# Patient Record
Sex: Female | Born: 1960 | Race: White | Hispanic: No | Marital: Married | Smoking: Former smoker
Health system: Southern US, Community
[De-identification: ages and names within clinical notes are randomized; demographics above are authoritative.]

## PROBLEM LIST (undated history)

## (undated) DIAGNOSIS — K219 Gastro-esophageal reflux disease without esophagitis: Secondary | ICD-10-CM

## (undated) DIAGNOSIS — E042 Nontoxic multinodular goiter: Secondary | ICD-10-CM

## (undated) DIAGNOSIS — R32 Unspecified urinary incontinence: Secondary | ICD-10-CM

## (undated) DIAGNOSIS — I071 Rheumatic tricuspid insufficiency: Principal | ICD-10-CM

## (undated) DIAGNOSIS — G4700 Insomnia, unspecified: Secondary | ICD-10-CM

## (undated) DIAGNOSIS — M5030 Other cervical disc degeneration, unspecified cervical region: Secondary | ICD-10-CM

## (undated) DIAGNOSIS — K625 Hemorrhage of anus and rectum: Secondary | ICD-10-CM

## (undated) DIAGNOSIS — M549 Dorsalgia, unspecified: Secondary | ICD-10-CM

## (undated) DIAGNOSIS — Z87440 Personal history of urinary (tract) infections: Secondary | ICD-10-CM

## (undated) DIAGNOSIS — T7840XA Allergy, unspecified, initial encounter: Secondary | ICD-10-CM

## (undated) DIAGNOSIS — Z0000 Encounter for general adult medical examination without abnormal findings: Secondary | ICD-10-CM

## (undated) DIAGNOSIS — K5900 Constipation, unspecified: Secondary | ICD-10-CM

## (undated) DIAGNOSIS — IMO0001 Reserved for inherently not codable concepts without codable children: Secondary | ICD-10-CM

## (undated) DIAGNOSIS — L309 Dermatitis, unspecified: Secondary | ICD-10-CM

## (undated) DIAGNOSIS — K648 Other hemorrhoids: Secondary | ICD-10-CM

## (undated) DIAGNOSIS — R05 Cough: Secondary | ICD-10-CM

## (undated) HISTORY — DX: Nontoxic multinodular goiter: E04.2

## (undated) HISTORY — DX: Dermatitis, unspecified: L30.9

## (undated) HISTORY — DX: Dorsalgia, unspecified: M54.9

## (undated) HISTORY — DX: Unspecified urinary incontinence: R32

## (undated) HISTORY — PX: TONSILLECTOMY: SUR1361

## (undated) HISTORY — DX: Encounter for general adult medical examination without abnormal findings: Z00.00

## (undated) HISTORY — DX: Cough: R05

## (undated) HISTORY — DX: Gastro-esophageal reflux disease without esophagitis: K21.9

## (undated) HISTORY — DX: Reserved for inherently not codable concepts without codable children: IMO0001

## (undated) HISTORY — DX: Personal history of urinary (tract) infections: Z87.440

## (undated) HISTORY — DX: Other cervical disc degeneration, unspecified cervical region: M50.30

## (undated) HISTORY — DX: Allergy, unspecified, initial encounter: T78.40XA

## (undated) HISTORY — DX: Constipation, unspecified: K59.00

## (undated) HISTORY — DX: Other hemorrhoids: K64.8

## (undated) HISTORY — PX: OTHER SURGICAL HISTORY: SHX169

## (undated) HISTORY — DX: Rheumatic tricuspid insufficiency: I07.1

## (undated) HISTORY — DX: Insomnia, unspecified: G47.00

## (undated) HISTORY — DX: Hemorrhage of anus and rectum: K62.5

---

## 1998-05-05 ENCOUNTER — Encounter: Admission: RE | Admit: 1998-05-05 | Discharge: 1998-08-03 | Payer: Self-pay | Admitting: Gynecology

## 1998-07-21 ENCOUNTER — Inpatient Hospital Stay (HOSPITAL_COMMUNITY): Admission: AD | Admit: 1998-07-21 | Discharge: 1998-07-23 | Payer: Self-pay | Admitting: Obstetrics and Gynecology

## 1998-09-03 ENCOUNTER — Other Ambulatory Visit: Admission: RE | Admit: 1998-09-03 | Discharge: 1998-09-03 | Payer: Self-pay | Admitting: Obstetrics and Gynecology

## 1998-09-03 ENCOUNTER — Encounter (HOSPITAL_COMMUNITY): Admission: RE | Admit: 1998-09-03 | Discharge: 1998-12-02 | Payer: Self-pay | Admitting: Obstetrics and Gynecology

## 1998-12-03 ENCOUNTER — Encounter (HOSPITAL_COMMUNITY): Admission: RE | Admit: 1998-12-03 | Discharge: 1999-03-03 | Payer: Self-pay | Admitting: Obstetrics and Gynecology

## 1999-03-08 ENCOUNTER — Encounter (HOSPITAL_COMMUNITY): Admission: RE | Admit: 1999-03-08 | Discharge: 1999-06-06 | Payer: Self-pay | Admitting: Obstetrics and Gynecology

## 1999-06-08 ENCOUNTER — Encounter: Admission: RE | Admit: 1999-06-08 | Discharge: 1999-08-16 | Payer: Self-pay | Admitting: Obstetrics and Gynecology

## 1999-10-14 ENCOUNTER — Other Ambulatory Visit: Admission: RE | Admit: 1999-10-14 | Discharge: 1999-10-14 | Payer: Self-pay | Admitting: Obstetrics and Gynecology

## 2000-11-02 ENCOUNTER — Other Ambulatory Visit: Admission: RE | Admit: 2000-11-02 | Discharge: 2000-11-02 | Payer: Self-pay | Admitting: Obstetrics and Gynecology

## 2002-05-23 ENCOUNTER — Other Ambulatory Visit: Admission: RE | Admit: 2002-05-23 | Discharge: 2002-05-23 | Payer: Self-pay | Admitting: Gynecology

## 2002-07-10 ENCOUNTER — Other Ambulatory Visit: Admission: RE | Admit: 2002-07-10 | Discharge: 2002-07-10 | Payer: Self-pay | Admitting: Gynecology

## 2003-02-10 ENCOUNTER — Inpatient Hospital Stay (HOSPITAL_COMMUNITY): Admission: AD | Admit: 2003-02-10 | Discharge: 2003-02-10 | Payer: Self-pay | Admitting: Gynecology

## 2003-02-28 ENCOUNTER — Inpatient Hospital Stay (HOSPITAL_COMMUNITY): Admission: AD | Admit: 2003-02-28 | Discharge: 2003-03-02 | Payer: Self-pay | Admitting: Gynecology

## 2003-03-09 ENCOUNTER — Encounter: Admission: RE | Admit: 2003-03-09 | Discharge: 2003-04-08 | Payer: Self-pay | Admitting: Gynecology

## 2003-04-16 ENCOUNTER — Other Ambulatory Visit: Admission: RE | Admit: 2003-04-16 | Discharge: 2003-04-16 | Payer: Self-pay | Admitting: Gynecology

## 2003-05-09 ENCOUNTER — Encounter: Admission: RE | Admit: 2003-05-09 | Discharge: 2003-06-08 | Payer: Self-pay | Admitting: Gynecology

## 2003-06-09 ENCOUNTER — Encounter: Admission: RE | Admit: 2003-06-09 | Discharge: 2003-07-09 | Payer: Self-pay | Admitting: Gynecology

## 2003-08-07 ENCOUNTER — Encounter: Admission: RE | Admit: 2003-08-07 | Discharge: 2003-09-06 | Payer: Self-pay | Admitting: Gynecology

## 2003-10-07 ENCOUNTER — Encounter: Admission: RE | Admit: 2003-10-07 | Discharge: 2003-11-06 | Payer: Self-pay | Admitting: Gynecology

## 2003-12-07 ENCOUNTER — Encounter: Admission: RE | Admit: 2003-12-07 | Discharge: 2004-01-06 | Payer: Self-pay | Admitting: Gynecology

## 2004-01-07 ENCOUNTER — Encounter: Admission: RE | Admit: 2004-01-07 | Discharge: 2004-02-06 | Payer: Self-pay | Admitting: Gynecology

## 2004-02-25 ENCOUNTER — Ambulatory Visit: Payer: Self-pay | Admitting: Internal Medicine

## 2004-04-26 ENCOUNTER — Ambulatory Visit: Payer: Self-pay | Admitting: Internal Medicine

## 2004-05-31 ENCOUNTER — Other Ambulatory Visit: Admission: RE | Admit: 2004-05-31 | Discharge: 2004-05-31 | Payer: Self-pay | Admitting: Gynecology

## 2004-06-02 ENCOUNTER — Ambulatory Visit: Payer: Self-pay | Admitting: Internal Medicine

## 2005-02-21 ENCOUNTER — Ambulatory Visit: Payer: Self-pay | Admitting: Internal Medicine

## 2005-03-06 ENCOUNTER — Ambulatory Visit: Payer: Self-pay | Admitting: Internal Medicine

## 2005-03-20 ENCOUNTER — Ambulatory Visit: Payer: Self-pay | Admitting: Internal Medicine

## 2005-05-01 ENCOUNTER — Ambulatory Visit: Payer: Self-pay | Admitting: Internal Medicine

## 2005-05-29 ENCOUNTER — Ambulatory Visit: Payer: Self-pay | Admitting: Internal Medicine

## 2005-07-17 ENCOUNTER — Ambulatory Visit: Payer: Self-pay | Admitting: Pulmonary Disease

## 2005-10-28 ENCOUNTER — Ambulatory Visit: Payer: Self-pay | Admitting: Family Medicine

## 2005-11-09 ENCOUNTER — Ambulatory Visit: Payer: Self-pay | Admitting: Family Medicine

## 2005-11-17 ENCOUNTER — Ambulatory Visit: Payer: Self-pay | Admitting: Family Medicine

## 2005-11-28 ENCOUNTER — Ambulatory Visit: Payer: Self-pay | Admitting: Family Medicine

## 2006-06-25 ENCOUNTER — Other Ambulatory Visit: Admission: RE | Admit: 2006-06-25 | Discharge: 2006-06-25 | Payer: Self-pay | Admitting: Gynecology

## 2006-07-10 ENCOUNTER — Encounter (INDEPENDENT_AMBULATORY_CARE_PROVIDER_SITE_OTHER): Payer: Self-pay | Admitting: *Deleted

## 2006-07-10 ENCOUNTER — Ambulatory Visit (HOSPITAL_COMMUNITY): Admission: RE | Admit: 2006-07-10 | Discharge: 2006-07-10 | Payer: Self-pay | Admitting: Gastroenterology

## 2006-10-24 ENCOUNTER — Encounter: Admission: RE | Admit: 2006-10-24 | Discharge: 2007-01-22 | Payer: Self-pay | Admitting: Otolaryngology

## 2007-01-07 DIAGNOSIS — J45909 Unspecified asthma, uncomplicated: Secondary | ICD-10-CM | POA: Insufficient documentation

## 2007-01-07 DIAGNOSIS — F329 Major depressive disorder, single episode, unspecified: Secondary | ICD-10-CM | POA: Insufficient documentation

## 2007-07-24 ENCOUNTER — Other Ambulatory Visit: Admission: RE | Admit: 2007-07-24 | Discharge: 2007-07-24 | Payer: Self-pay | Admitting: Gynecology

## 2007-08-09 ENCOUNTER — Ambulatory Visit (HOSPITAL_COMMUNITY): Admission: RE | Admit: 2007-08-09 | Discharge: 2007-08-09 | Payer: Self-pay | Admitting: Otolaryngology

## 2007-09-08 LAB — HM PAP SMEAR

## 2008-07-07 ENCOUNTER — Encounter: Admission: RE | Admit: 2008-07-07 | Discharge: 2008-07-07 | Payer: Self-pay | Admitting: Family Medicine

## 2010-05-08 ENCOUNTER — Encounter: Payer: Self-pay | Admitting: Otolaryngology

## 2010-09-02 NOTE — Op Note (Signed)
Patricia Roy, ROTHMAN                 ACCOUNT NO.:  [id number]   MEDICAL RECORD NO.:  [id number]          PATIENT TYPE:  AMB   LOCATION:  ENDO                         FACILITY:  [facility]   PHYSICIAN:  Shirley Friar, MDDATE OF BIRTH:  [birth year]   DATE OF PROCEDURE:  DATE OF DISCHARGE:  07/10/2006                               OPERATIVE REPORT   INDICATION:  Hoarseness, chronic cough, evaluated for atypical reflux.   FINDINGS:  DeMeester score on day 1 of 20.4 with normal being less than  14.72.  DeMeester score on day 2 was 12.7 with normal being less than  14.72.  Total DeMeester score 20.1 with normal being less than 14.72.   IMPRESSION:  Significant acidic reflux on her 48-hour pH Bravo study.  Will recommend a trial of Zegerid 40 mg p.o. nightly x1 month.  Will  have the patient follow up with Dr. Ewing Schlein in three weeks.      Shirley Friar, MD  Electronically Signed     VCS/MEDQ  D:  07/27/2006  T:  07/28/2006  Job:  [id number]

## 2010-09-02 NOTE — Discharge Summary (Signed)
NAME:  Patricia Roy, Patricia Roy                           ACCOUNT NO.:  [id number]   MEDICAL RECORD NO.:  [id number]                   PATIENT TYPE:  INP   LOCATION:  9107                                 FACILITY:  [facility]   PHYSICIAN:  Ivor Costa. Farrel Gobble, M.D.              DATE OF BIRTH:  01/04/1961   DATE OF ADMISSION:  02/28/2003  DATE OF DISCHARGE:  03/02/2003                                 DISCHARGE SUMMARY   DISCHARGE DIAGNOSES:  Intrauterine pregnancy at term with a spontaneous  vaginal delivery.   PROCEDURE:  Spontaneous vaginal delivery of a viable female with a perineal  first-degree laceration with repair.   HISTORY OF PRESENT ILLNESS:  A 50 year old gravida 3, para 2, with a last  menstrual period of May 30, 2002, with an [facility] of March 07, 2003.  Estimated gestational age of 89 with contractions.  Prenatally, she had no  known complications. She does have history of asthma. She did have a history  of gestational diabetes with the second baby, but not with the current  pregnancy.   LABORATORIES:  Blood type is A positive, antibody, serology nonreactive,  rubella positive, hepatitis nonreactive, HIV nonreactive, and group B strep  negative.   HOSPITAL COURSE AND TREATMENT:  The patient presented on February 28, 2003,  in labor. She was 39 weeks.  She progressed to complete. She delivered a  viable female with a first-degree perineal laceration with repair.  The  baby's Apgar's were 9/9. Birth weight was 6 pounds 13 ounces. Postpartum she  remained afebrile, voiding, and was discharged home on her second postpartum  day. She did have a history of postpartum depression with her second baby,  but declined medication at this time and will call if needed.   DISCHARGE LABORATORY:  White count 13.5, hemoglobin 13.3, hematocrit 37.4,  and platelet count 180,00.   DISPOSITION:  She is discharged home with instructions to follow up in six  weeks or as needed. She is to continue prenatal  vitamins and iron daily,  Motrin as needed for pain.     Davonna Belling. Young, N.P.                      Ivor Costa. Farrel Gobble, M.D.    [place] Lanius  D:  03/25/2003  T:  03/25/2003  Job:  [id number]

## 2010-09-02 NOTE — Op Note (Signed)
Patricia Roy, Patricia Roy                 ACCOUNT NO.:  [id number]   MEDICAL RECORD NO.:  [id number]          PATIENT TYPE:  AMB   LOCATION:  ENDO                         FACILITY:  [facility]   PHYSICIAN:  Shirley Friar, MDDATE OF BIRTH:  21-Jul-1960   DATE OF PROCEDURE:  07/10/2006  DATE OF DISCHARGE:                               OPERATIVE REPORT   PROCEDURE:  Upper endoscopy and Bravo capsule placement.   INDICATION:  Chronic cough, hoarseness, questionable atypical reflux.   MEDICATIONS:  Fentanyl 75 mcg IV, Versed 7 mg IV, Cetacaine spray x2.   FINDINGS:  Endoscope was inserted to the oropharynx and esophagus was  intubated which was normal in its entirety.  The GE junction was noted  to be at approximately 35 cm from the incisors.  Endoscope was passed  down into the stomach which revealed scattered erythematous areas in the  antrum consistent with the erosions.  The rest of the stomach lining was  normal.  Retroflexion revealed normal angularis, cardia and fundus.  Endoscope was straightened and advanced down to the duodenal bulb and  second portion of duodenum which were both normal.  Endoscope withdrawn  back into the stomach and two biopsies of the antrum were taken.  Endoscope was then withdrawn back into the esophagus and GE junction was  again verified at 35 cm from the incisors.  Endoscope was then withdrawn  completely and a Bravo capsule catheter was inserted.  In the usual  fashion, the Bravo capsule was attached at 29 cm which is approximately  6 cm above the GE junction.  Endoscope was reinserted and verified  correct and adequate placement of the Bravo capsule without any  immediate complications.   ASSESSMENT:  1. Antral erosions status post biopsy.  2. Otherwise normal esophagogastroduodenoscopy.  3. Status post Bravo capsule placement in esophagus.   PLAN:  Follow-up on Bravo capsule reading to determine whether her  atypical symptoms are due to acidic  reflux.      Shirley Friar, MD  Electronically Signed     VCS/MEDQ  D:  07/10/2006  T:  07/10/2006  Job:  [id number]   cc:   Petra Kuba, M.D.  Hermelinda Medicus, M.D.  Jeffrey A. Tawanna Cooler, MD

## 2011-05-09 ENCOUNTER — Ambulatory Visit: Payer: Self-pay | Admitting: Cardiology

## 2011-05-17 ENCOUNTER — Ambulatory Visit (INDEPENDENT_AMBULATORY_CARE_PROVIDER_SITE_OTHER): Payer: BC Managed Care – PPO | Admitting: Cardiology

## 2011-05-17 ENCOUNTER — Encounter: Payer: Self-pay | Admitting: Cardiology

## 2011-05-17 VITALS — BP 102/50 | HR 79 | Ht 64.0 in | Wt 148.0 lb

## 2011-05-17 DIAGNOSIS — I071 Rheumatic tricuspid insufficiency: Secondary | ICD-10-CM

## 2011-05-17 DIAGNOSIS — R0609 Other forms of dyspnea: Secondary | ICD-10-CM

## 2011-05-17 DIAGNOSIS — I340 Nonrheumatic mitral (valve) insufficiency: Secondary | ICD-10-CM

## 2011-05-17 DIAGNOSIS — R0600 Dyspnea, unspecified: Secondary | ICD-10-CM

## 2011-05-17 DIAGNOSIS — R0602 Shortness of breath: Secondary | ICD-10-CM

## 2011-05-17 HISTORY — DX: Rheumatic tricuspid insufficiency: I07.1

## 2011-05-17 NOTE — Progress Notes (Signed)
  HPI: 51 year old female for evaluation of dyspnea. Patient states she has had a cough for approximately 12 years. She has had extensive evaluations including pulmonary workup, ENT and GI evaluation. Apparently those have been unrevealing. I do not have records available. She did have previous GI evaluation that suggested reflux but she stated medication did not improve. She has times where she will have coughing spells for 20 minutes. These can occur at any time but are more prominent at night. They are not related to meals. When she has these coughing spells she feels short of breath. She otherwise does not have dyspnea on exertion, orthopnea, PND, pedal edema, syncope or exertional chest pain. Note her cough is nonproductive. Her sister saw a television show where cough was related to mitral regurgitation. She therefore presented for evaluation.  No current outpatient prescriptions on file.    Allergies  Allergen Reactions  . Paroxetine   . Sulfonamide Derivatives     Past Medical History  Diagnosis Date  . Asthma   . Cough   . GERD (gastroesophageal reflux disease)     Past Surgical History  Procedure Date  . Tonsillectomy     History   Social History  . Marital Status: Married    Spouse Name: N/A    Number of Children: 3  . Years of Education: N/A   Occupational History  .     Social History Main Topics  . Smoking status: Current Everyday Smoker -- 0.5 packs/day  . Smokeless tobacco: Not on file  . Alcohol Use: Yes     Occasional  . Drug Use: Not on file  . Sexually Active: Not on file   Other Topics Concern  . Not on file   Social History Narrative  . No narrative on file    Family History  Problem Relation Age of Onset  . Heart disease Mother     H/O valve replacement at 86    ROS: Cough but no fevers or chills, hemoptysis, dysphasia, odynophagia, melena, hematochezia, dysuria, hematuria, rash, seizure activity, orthopnea, PND, pedal edema, claudication.  Remaining systems are negative.  Physical Exam:   Blood pressure 102/50, pulse 79, height 5\' 4"  (1.626 m), weight 148 lb (67.132 kg).  General:  Well developed/well nourished in NAD Skin warm/dry Patient not depressed No peripheral clubbing Back-normal HEENT-normal/normal eyelids Neck supple/normal carotid upstroke bilaterally; no bruits; no JVD; no thyromegaly chest - CTA/ normal expansion CV - RRR/normal S1 and S2; no rubs or gallops;  PMI nondisplaced; 1/6 systolic ejection murmur Abdomen -NT/ND, no HSM, no mass, + bowel sounds, no bruit 2+ femoral pulses, no bruits Ext-no edema, chords, 2+ DP Neuro-grossly nonfocal  ECG sinus rhythm at a rate of 79. RV conduction delay.

## 2011-05-17 NOTE — Assessment & Plan Note (Signed)
Doubt this is causing her cough and dyspnea. Echocardiogram to exclude MR. Not noted on examination.

## 2011-05-17 NOTE — Assessment & Plan Note (Signed)
Patient has a chronic cough for approximately 12 years associated with dyspnea. She only feels shortness of breath with her cough. She otherwise does not have dyspnea on exertion, orthopnea, PND. She is not volume overloaded on examination. Her electrocardiogram is normal. I doubt this represents cardiac etiology. We will arrange an echocardiogram to quantify LV function. If normal I do not think further cardiac evaluation is indicated.

## 2011-05-17 NOTE — Patient Instructions (Signed)
Your physician recommends that you schedule a follow-up appointment in: AS NEEDED PENDING TEST RESULTS  Your physician has requested that you have an echocardiogram. Echocardiography is a painless test that uses sound waves to create images of your heart. It provides your doctor with information about the size and shape of your heart and how well your heart's chambers and valves are working. This procedure takes approximately one hour. There are no restrictions for this procedure.    

## 2011-05-23 ENCOUNTER — Telehealth: Payer: Self-pay | Admitting: Cardiology

## 2011-05-23 ENCOUNTER — Ambulatory Visit (HOSPITAL_COMMUNITY): Payer: BC Managed Care – PPO | Attending: Cardiology | Admitting: Radiology

## 2011-05-23 DIAGNOSIS — R0989 Other specified symptoms and signs involving the circulatory and respiratory systems: Secondary | ICD-10-CM

## 2011-05-23 DIAGNOSIS — R011 Cardiac murmur, unspecified: Secondary | ICD-10-CM | POA: Insufficient documentation

## 2011-05-23 DIAGNOSIS — R0609 Other forms of dyspnea: Secondary | ICD-10-CM

## 2011-05-23 DIAGNOSIS — I059 Rheumatic mitral valve disease, unspecified: Secondary | ICD-10-CM

## 2011-05-23 DIAGNOSIS — R0602 Shortness of breath: Secondary | ICD-10-CM

## 2011-05-23 NOTE — Telephone Encounter (Signed)
Pt rtn call to [place], [phone]

## 2011-05-23 NOTE — Telephone Encounter (Signed)
Spoke with pt, aware of echo results. 

## 2013-07-08 LAB — HM MAMMOGRAPHY

## 2014-07-28 ENCOUNTER — Ambulatory Visit (INDEPENDENT_AMBULATORY_CARE_PROVIDER_SITE_OTHER): Payer: Managed Care, Other (non HMO) | Admitting: Podiatry

## 2014-07-28 ENCOUNTER — Encounter: Payer: Self-pay | Admitting: Podiatry

## 2014-07-28 VITALS — BP 116/79 | HR 92 | Ht 64.0 in | Wt 145.0 lb

## 2014-07-28 DIAGNOSIS — M79673 Pain in unspecified foot: Secondary | ICD-10-CM | POA: Diagnosis not present

## 2014-07-28 DIAGNOSIS — S90922A Unspecified superficial injury of left foot, initial encounter: Secondary | ICD-10-CM | POA: Insufficient documentation

## 2014-07-28 DIAGNOSIS — M79606 Pain in leg, unspecified: Secondary | ICD-10-CM | POA: Insufficient documentation

## 2014-07-28 DIAGNOSIS — M79605 Pain in left leg: Secondary | ICD-10-CM

## 2014-07-28 NOTE — Patient Instructions (Signed)
Seen for pain in left foot. Injury during pedicure is resolving now. Had muscle strain on left with abnormal gait pattern. Need custom orthotics. Will contact insurance co for benefit info.

## 2014-07-28 NOTE — Progress Notes (Signed)
Subjective: 54 year old female presents stating that she has had a pedicure and got a cut at the base of the 5th Metatarsal that caused pain and swelling for a few days. It is resolved but now having pain in left calf. Incident happened about 10 days ago.   Review of Systems - Negative except wearing glasses.  Objective: Dermatologic: Old superficial cut in skin at the base of the 5th metatarsal. No associated erythema or edema noted. Neurovascular status are within normal. Orthopedic: Prominent sharp bony projection at the injury site, which correspond with the styloid process. Enlarged metatarsal head with hypermobility bilateral. Painful left calf with squeeze without edema or erythema.  Assessment: Skin abrasion over prominent Styloid process left. Area has healed.  Left calf pain secondary to abnormal gait pattern. Hypermobile first ray bilateral.  Plan: Reviewed findings. May benefit from custom orthotics.

## 2014-08-21 ENCOUNTER — Ambulatory Visit: Payer: BC Managed Care – PPO | Admitting: Family Medicine

## 2014-09-07 ENCOUNTER — Telehealth: Payer: Self-pay | Admitting: *Deleted

## 2014-09-07 ENCOUNTER — Encounter: Payer: Self-pay | Admitting: *Deleted

## 2014-09-07 NOTE — Telephone Encounter (Signed)
Pre-Visit Call completed with patient and chart updated.   Pre-Visit Info documented in Specialty Comments under [employer].    

## 2014-09-07 NOTE — Addendum Note (Signed)
Addended by: Noreene LarssonLARSON, Ruperto Kiernan A on: 09/07/2014 02:08 PM   Modules accepted: Medications

## 2014-09-07 NOTE — Telephone Encounter (Signed)
Unable to reach patient at time of Pre-Visit Call.  Left message for patient to return call when available.    

## 2014-09-08 ENCOUNTER — Ambulatory Visit (INDEPENDENT_AMBULATORY_CARE_PROVIDER_SITE_OTHER): Payer: Managed Care, Other (non HMO) | Admitting: Family Medicine

## 2014-09-08 ENCOUNTER — Encounter: Payer: Self-pay | Admitting: Family Medicine

## 2014-09-08 ENCOUNTER — Ambulatory Visit (HOSPITAL_BASED_OUTPATIENT_CLINIC_OR_DEPARTMENT_OTHER)
Admission: RE | Admit: 2014-09-08 | Discharge: 2014-09-08 | Disposition: A | Discharge status: Home / Self Care | Payer: Managed Care, Other (non HMO) | Source: Ambulatory Visit | Attending: Family Medicine | Admitting: Family Medicine

## 2014-09-08 VITALS — BP 120/82 | HR 84 | Temp 97.5°F | Ht 64.0 in | Wt 147.1 lb

## 2014-09-08 DIAGNOSIS — R319 Hematuria, unspecified: Secondary | ICD-10-CM

## 2014-09-08 DIAGNOSIS — Z0000 Encounter for general adult medical examination without abnormal findings: Secondary | ICD-10-CM | POA: Diagnosis not present

## 2014-09-08 DIAGNOSIS — K921 Melena: Secondary | ICD-10-CM

## 2014-09-08 DIAGNOSIS — K648 Other hemorrhoids: Secondary | ICD-10-CM | POA: Insufficient documentation

## 2014-09-08 DIAGNOSIS — N390 Urinary tract infection, site not specified: Secondary | ICD-10-CM

## 2014-09-08 DIAGNOSIS — J45909 Unspecified asthma, uncomplicated: Secondary | ICD-10-CM | POA: Diagnosis not present

## 2014-09-08 DIAGNOSIS — E041 Nontoxic single thyroid nodule: Secondary | ICD-10-CM | POA: Diagnosis not present

## 2014-09-08 DIAGNOSIS — E049 Nontoxic goiter, unspecified: Secondary | ICD-10-CM

## 2014-09-08 DIAGNOSIS — R053 Chronic cough: Secondary | ICD-10-CM

## 2014-09-08 DIAGNOSIS — R05 Cough: Secondary | ICD-10-CM | POA: Diagnosis not present

## 2014-09-08 DIAGNOSIS — E010 Iodine-deficiency related diffuse (endemic) goiter: Secondary | ICD-10-CM | POA: Diagnosis not present

## 2014-09-08 DIAGNOSIS — E042 Nontoxic multinodular goiter: Secondary | ICD-10-CM

## 2014-09-08 DIAGNOSIS — G4700 Insomnia, unspecified: Secondary | ICD-10-CM

## 2014-09-08 DIAGNOSIS — Z87440 Personal history of urinary (tract) infections: Secondary | ICD-10-CM

## 2014-09-08 DIAGNOSIS — R1013 Epigastric pain: Secondary | ICD-10-CM

## 2014-09-08 DIAGNOSIS — K219 Gastro-esophageal reflux disease without esophagitis: Secondary | ICD-10-CM

## 2014-09-08 DIAGNOSIS — K625 Hemorrhage of anus and rectum: Secondary | ICD-10-CM

## 2014-09-08 DIAGNOSIS — R0600 Dyspnea, unspecified: Secondary | ICD-10-CM

## 2014-09-08 DIAGNOSIS — I071 Rheumatic tricuspid insufficiency: Secondary | ICD-10-CM | POA: Diagnosis not present

## 2014-09-08 DIAGNOSIS — S90922A Unspecified superficial injury of left foot, initial encounter: Secondary | ICD-10-CM

## 2014-09-08 HISTORY — DX: Other hemorrhoids: K64.8

## 2014-09-08 HISTORY — DX: Hemorrhage of anus and rectum: K62.5

## 2014-09-08 HISTORY — DX: Personal history of urinary (tract) infections: Z87.440

## 2014-09-08 HISTORY — DX: Chronic cough: R05.3

## 2014-09-08 LAB — COMPREHENSIVE METABOLIC PANEL
ALT: 16 U/L (ref 0–35)
AST: 19 U/L (ref 0–37)
Albumin: 4.4 g/dL (ref 3.5–5.2)
Alkaline Phosphatase: 45 U/L (ref 39–117)
BUN: 12 mg/dL (ref 6–23)
CO2: 29 mEq/L (ref 19–32)
Calcium: 9.6 mg/dL (ref 8.4–10.5)
Chloride: 103 mEq/L (ref 96–112)
Creatinine, Ser: 0.85 mg/dL (ref 0.40–1.20)
GFR: 74.53 mL/min (ref >60.00)
Glucose, Bld: 93 mg/dL (ref 70–99)
Potassium: 3.8 mEq/L (ref 3.5–5.1)
Sodium: 137 mEq/L (ref 135–145)
Total Bilirubin: 0.4 mg/dL (ref 0.2–1.2)
Total Protein: 6.9 g/dL (ref 6.0–8.3)

## 2014-09-08 LAB — URINALYSIS
Bilirubin Urine: NEGATIVE
Hgb urine dipstick: NEGATIVE
Ketones, ur: NEGATIVE
Leukocytes, UA: NEGATIVE
Nitrite: NEGATIVE
Specific Gravity, Urine: 1.015 (ref 1.000–1.030)
Total Protein, Urine: NEGATIVE
Urine Glucose: NEGATIVE
Urobilinogen, UA: 0.2 (ref 0.0–1.0)
pH: 7.5 (ref 5.0–8.0)

## 2014-09-08 LAB — TSH: TSH: 1.07 u[IU]/mL (ref 0.35–4.50)

## 2014-09-08 LAB — LIPID PANEL
Cholesterol: 159 mg/dL (ref 0–200)
HDL: 65 mg/dL (ref >39.00)
LDL Cholesterol: 68 mg/dL (ref 0–99)
NonHDL: 94
Total CHOL/HDL Ratio: 2
Triglycerides: 132 mg/dL (ref 0.0–149.0)
VLDL: 26.4 mg/dL (ref 0.0–40.0)

## 2014-09-08 LAB — CBC
HCT: 40.8 % (ref 36.0–46.0)
Hemoglobin: 13.9 g/dL (ref 12.0–15.0)
MCHC: 34.1 g/dL (ref 30.0–36.0)
MCV: 90.4 fl (ref 78.0–100.0)
Platelets: 243 10*3/uL (ref 150.0–400.0)
RBC: 4.51 Mil/uL (ref 3.87–5.11)
RDW: 13.3 % (ref 11.5–15.5)
WBC: 8.8 10*3/uL (ref 4.0–10.5)

## 2014-09-08 NOTE — Progress Notes (Signed)
Pre visit review using our clinic review tool, if applicable. No additional management support is needed unless otherwise documented below in the visit note. 

## 2014-09-08 NOTE — Patient Instructions (Addendum)
Encouraged good sleep hygiene such as dark, quiet room. No blue/green glowing lights such as computer screens in bedroom. No alcohol or stimulants in evening. Cut down on caffeine as able. Regular exercise is helpful but not just prior to bed time. Melatonin 2- 10 mg   Probiotic daily such as [employer]est[employer] Advantage or [employer] for Adults A healthy lifestyle and preventive care can promote health and wellness. Preventive health guidelines for women include the following key practices.  A routine yearly physical is a good way to check with your health care provider about your health and preventive screening. It is a chance to share any concerns and updates on your health and to receive a thorough exam.  Visit your dentist for a routine exam and preventive care every 6 months. Brush your teeth twice a day and floss once a day. Good oral hygiene prevents tooth decay and gum disease.  The frequency of eye exams is based on your age, health, family medical history, use of contact lenses, and other factors. Follow your health care provider's recommendations for frequency of eye exams.  Eat a healthy diet. Foods like vegetables, fruits, whole grains, low-fat dairy products, and lean protein foods contain the nutrients you need without too many calories. Decrease your intake of foods high in solid fats, added sugars, and salt. Eat the right amount of calories for you.Get information about a proper diet from your health care provider, if necessary.  Regular physical exercise is one of the most important things you can do for your health. Most adults should get at least 150 minutes of moderate-intensity exercise (any activity that increases your heart rate and causes you to sweat) each week. In addition, most adults need muscle-strengthening exercises on 2 or more days a week.  Maintain a healthy weight. The body mass index (BMI) is a screening tool to identify possible weight  problems. It provides an estimate of body fat based on height and weight. Your health care provider can find your BMI and can help you achieve or maintain a healthy weight.For adults 20 years and older:  A BMI below 18.5 is considered underweight.  A BMI of 18.5 to 24.9 is normal.  A BMI of 25 to 29.9 is considered overweight.  A BMI of 30 and above is considered obese.  Maintain normal blood lipids and cholesterol levels by exercising and minimizing your intake of saturated fat. Eat a balanced diet with plenty of fruit and vegetables. Blood tests for lipids and cholesterol should begin at age 2 and be repeated every 5 years. If your lipid or cholesterol levels are high, you are over 50, or you are at high risk for heart disease, you may need your cholesterol levels checked more frequently.Ongoing high lipid and cholesterol levels should be treated with medicines if diet and exercise are not working.  If you smoke, find out from your health care provider how to quit. If you do not use tobacco, do not start.  Lung cancer screening is recommended for adults aged 74-80 years who are at high risk for developing lung cancer because of a history of smoking. A yearly low-dose CT scan of the lungs is recommended for people who have at least a 30-pack-year history of smoking and are a current smoker or have quit within the past 15 years. A pack year of smoking is smoking an average of 1 pack of cigarettes a day for 1 year (for example: 1 pack a day  for 30 years or 2 packs a day for 15 years). Yearly screening should continue until the smoker has stopped smoking for at least 15 years. Yearly screening should be stopped for people who develop a health problem that would prevent them from having lung cancer treatment.  If you are pregnant, do not drink alcohol. If you are breastfeeding, be very cautious about drinking alcohol. If you are not pregnant and choose to drink alcohol, do not have more than 1 drink  per day. One drink is considered to be 12 ounces (355 mL) of beer, 5 ounces (148 mL) of wine, or 1.5 ounces (44 mL) of liquor.  Avoid use of street drugs. Do not share needles with anyone. Ask for help if you need support or instructions about stopping the use of drugs.  High blood pressure causes heart disease and increases the risk of stroke. Your blood pressure should be checked at least every 1 to 2 years. Ongoing high blood pressure should be treated with medicines if weight loss and exercise do not work.  If you are 52-29 years old, ask your health care provider if you should take aspirin to prevent strokes.  Diabetes screening involves taking a blood sample to check your fasting blood sugar level. This should be done once every 3 years, after age 31, if you are within normal weight and without risk factors for diabetes. Testing should be considered at a younger age or be carried out more frequently if you are overweight and have at least 1 risk factor for diabetes.  Breast cancer screening is essential preventive care for women. You should practice "breast self-awareness." This means understanding the normal appearance and feel of your breasts and may include breast self-examination. Any changes detected, no matter how small, should be reported to a health care provider. Women in their 92s and 30s should have a clinical breast exam (CBE) by a health care provider as part of a regular health exam every 1 to 3 years. After age 55, women should have a CBE every year. Starting at age 91, women should consider having a mammogram (breast X-ray test) every year. Women who have a family history of breast cancer should talk to their health care provider about genetic screening. Women at a high risk of breast cancer should talk to their health care providers about having an MRI and a mammogram every year.  Breast cancer gene (BRCA)-related cancer risk assessment is recommended for women who have family  members with BRCA-related cancers. BRCA-related cancers include breast, ovarian, tubal, and peritoneal cancers. Having family members with these cancers may be associated with an increased risk for harmful changes (mutations) in the breast cancer genes BRCA1 and BRCA2. Results of the assessment will determine the need for genetic counseling and BRCA1 and BRCA2 testing.  Routine pelvic exams to screen for cancer are no longer recommended for nonpregnant women who are considered low risk for cancer of the pelvic organs (ovaries, uterus, and vagina) and who do not have symptoms. Ask your health care provider if a screening pelvic exam is right for you.  If you have had past treatment for cervical cancer or a condition that could lead to cancer, you need Pap tests and screening for cancer for at least 20 years after your treatment. If Pap tests have been discontinued, your risk factors (such as having a new sexual partner) need to be reassessed to determine if screening should be resumed. Some women have medical problems that increase the chance  of getting cervical cancer. In these cases, your health care provider may recommend more frequent screening and Pap tests.  The HPV test is an additional test that may be used for cervical cancer screening. The HPV test looks for the virus that can cause the cell changes on the cervix. The cells collected during the Pap test can be tested for HPV. The HPV test could be used to screen women aged 58 years and older, and should be used in women of any age who have unclear Pap test results. After the age of 70, women should have HPV testing at the same frequency as a Pap test.  Colorectal cancer can be detected and often prevented. Most routine colorectal cancer screening begins at the age of 72 years and continues through age 37 years. However, your health care provider may recommend screening at an earlier age if you have risk factors for colon cancer. On a yearly basis,  your health care provider may provide home test kits to check for hidden blood in the stool. Use of a small camera at the end of a tube, to directly examine the colon (sigmoidoscopy or colonoscopy), can detect the earliest forms of colorectal cancer. Talk to your health care provider about this at age 14, when routine screening begins. Direct exam of the colon should be repeated every 5-10 years through age 1 years, unless early forms of pre-cancerous polyps or small growths are found.  People who are at an increased risk for hepatitis B should be screened for this virus. You are considered at high risk for hepatitis B if:  You were born in a country where hepatitis B occurs often. Talk with your health care provider about which countries are considered high risk.  Your parents were born in a high-risk country and you have not received a shot to protect against hepatitis B (hepatitis B vaccine).  You have HIV or AIDS.  You use needles to inject street drugs.  You live with, or have sex with, someone who has hepatitis B.  You get hemodialysis treatment.  You take certain medicines for conditions like cancer, organ transplantation, and autoimmune conditions.  Hepatitis C blood testing is recommended for all people born from [birth year] through 1965 and any individual with known risks for hepatitis C.  Practice safe sex. Use condoms and avoid high-risk sexual practices to reduce the spread of sexually transmitted infections (STIs). STIs include gonorrhea, chlamydia, syphilis, trichomonas, herpes, HPV, and human immunodeficiency virus (HIV). Herpes, HIV, and HPV are viral illnesses that have no cure. They can result in disability, cancer, and death.  You should be screened for sexually transmitted illnesses (STIs) including gonorrhea and chlamydia if:  You are sexually active and are younger than 24 years.  You are older than 24 years and your health care provider tells you that you are at risk for  this type of infection.  Your sexual activity has changed since you were last screened and you are at an increased risk for chlamydia or gonorrhea. Ask your health care provider if you are at risk.  If you are at risk of being infected with HIV, it is recommended that you take a prescription medicine daily to prevent HIV infection. This is called preexposure prophylaxis (PrEP). You are considered at risk if:  You are a heterosexual woman, are sexually active, and are at increased risk for HIV infection.  You take drugs by injection.  You are sexually active with a partner who has HIV.  Talk with your health care provider about whether you are at high risk of being infected with HIV. If you choose to begin PrEP, you should first be tested for HIV. You should then be tested every 3 months for as long as you are taking PrEP.  Osteoporosis is a disease in which the bones lose minerals and strength with aging. This can result in serious bone fractures or breaks. The risk of osteoporosis can be identified using a bone density scan. Women ages 63 years and over and women at risk for fractures or osteoporosis should discuss screening with their health care providers. Ask your health care provider whether you should take a calcium supplement or vitamin D to reduce the rate of osteoporosis.  Menopause can be associated with physical symptoms and risks. Hormone replacement therapy is available to decrease symptoms and risks. You should talk to your health care provider about whether hormone replacement therapy is right for you.  Use sunscreen. Apply sunscreen liberally and repeatedly throughout the day. You should seek shade when your shadow is shorter than you. Protect yourself by wearing long sleeves, pants, a wide-brimmed hat, and sunglasses year round, whenever you are outdoors.  Once a month, do a whole body skin exam, using a mirror to look at the skin on your back. Tell your health care provider of  new moles, moles that have irregular borders, moles that are larger than a pencil eraser, or moles that have changed in shape or color.  Stay current with required vaccines (immunizations).  Influenza vaccine. All adults should be immunized every year.  Tetanus, diphtheria, and acellular pertussis (Td, Tdap) vaccine. Pregnant women should receive 1 dose of Tdap vaccine during each pregnancy. The dose should be obtained regardless of the length of time since the last dose. Immunization is preferred during the 27th-36th week of gestation. An adult who has not previously received Tdap or who does not know her vaccine status should receive 1 dose of Tdap. This initial dose should be followed by tetanus and diphtheria toxoids (Td) booster doses every 10 years. Adults with an unknown or incomplete history of completing a 3-dose immunization series with Td-containing vaccines should begin or complete a primary immunization series including a Tdap dose. Adults should receive a Td booster every 10 years.  Varicella vaccine. An adult without evidence of immunity to varicella should receive 2 doses or a second dose if she has previously received 1 dose. Pregnant females who do not have evidence of immunity should receive the first dose after pregnancy. This first dose should be obtained before leaving the health care facility. The second dose should be obtained 4-8 weeks after the first dose.  Human papillomavirus (HPV) vaccine. Females aged 13-26 years who have not received the vaccine previously should obtain the 3-dose series. The vaccine is not recommended for use in pregnant females. However, pregnancy testing is not needed before receiving a dose. If a female is found to be pregnant after receiving a dose, no treatment is needed. In that case, the remaining doses should be delayed until after the pregnancy. Immunization is recommended for any person with an immunocompromised condition through the age of 29  years if she did not get any or all doses earlier. During the 3-dose series, the second dose should be obtained 4-8 weeks after the first dose. The third dose should be obtained 24 weeks after the first dose and 16 weeks after the second dose.  Zoster vaccine. One dose is recommended for adults  aged 27 years or older unless certain conditions are present.  Measles, mumps, and rubella (MMR) vaccine. Adults born before [birth year] generally are considered immune to measles and mumps. Adults born in [birth year] or later should have 1 or more doses of MMR vaccine unless there is a contraindication to the vaccine or there is laboratory evidence of immunity to each of the three diseases. A routine second dose of MMR vaccine should be obtained at least 28 days after the first dose for students attending postsecondary schools, health care workers, or international travelers. People who received inactivated measles vaccine or an unknown type of measles vaccine during 1963-1967 should receive 2 doses of MMR vaccine. People who received inactivated mumps vaccine or an unknown type of mumps vaccine before 1979 and are at high risk for mumps infection should consider immunization with 2 doses of MMR vaccine. For females of childbearing age, rubella immunity should be determined. If there is no evidence of immunity, females who are not pregnant should be vaccinated. If there is no evidence of immunity, females who are pregnant should delay immunization until after pregnancy. Unvaccinated health care workers born before [birth year] who lack laboratory evidence of measles, mumps, or rubella immunity or laboratory confirmation of disease should consider measles and mumps immunization with 2 doses of MMR vaccine or rubella immunization with 1 dose of MMR vaccine.  Pneumococcal 13-valent conjugate (PCV13) vaccine. When indicated, a person who is uncertain of her immunization history and has no record of immunization should receive the PCV13 vaccine.  An adult aged 10 years or older who has certain medical conditions and has not been previously immunized should receive 1 dose of PCV13 vaccine. This PCV13 should be followed with a dose of pneumococcal polysaccharide (PPSV23) vaccine. The PPSV23 vaccine dose should be obtained at least 8 weeks after the dose of PCV13 vaccine. An adult aged 51 years or older who has certain medical conditions and previously received 1 or more doses of PPSV23 vaccine should receive 1 dose of PCV13. The PCV13 vaccine dose should be obtained 1 or more years after the last PPSV23 vaccine dose.  Pneumococcal polysaccharide (PPSV23) vaccine. When PCV13 is also indicated, PCV13 should be obtained first. All adults aged 19 years and older should be immunized. An adult younger than age 75 years who has certain medical conditions should be immunized. Any person who resides in a nursing home or long-term care facility should be immunized. An adult smoker should be immunized. People with an immunocompromised condition and certain other conditions should receive both PCV13 and PPSV23 vaccines. People with human immunodeficiency virus (HIV) infection should be immunized as soon as possible after diagnosis. Immunization during chemotherapy or radiation therapy should be avoided. Routine use of PPSV23 vaccine is not recommended for American Indians, [place] Natives, or people younger than 65 years unless there are medical conditions that require PPSV23 vaccine. When indicated, people who have unknown immunization and have no record of immunization should receive PPSV23 vaccine. One-time revaccination 5 years after the first dose of PPSV23 is recommended for people aged 19-64 years who have chronic kidney failure, nephrotic syndrome, asplenia, or immunocompromised conditions. People who received 1-2 doses of PPSV23 before age 12 years should receive another dose of PPSV23 vaccine at age 25 years or later if at least 5 years have passed since the  previous dose. Doses of PPSV23 are not needed for people immunized with PPSV23 at or after age 29 years.  Meningococcal vaccine. Adults with asplenia or persistent complement component  deficiencies should receive 2 doses of quadrivalent meningococcal conjugate (MenACWY-D) vaccine. The doses should be obtained at least 2 months apart. Microbiologists working with certain meningococcal bacteria, [employer] recruits, people at risk during an outbreak, and people who travel to or live in countries with a high rate of meningitis should be immunized. A first-year college student up through age 28 years who is living in a residence hall should receive a dose if she did not receive a dose on or after her 16th birthday. Adults who have certain high-risk conditions should receive one or more doses of vaccine.  Hepatitis A vaccine. Adults who wish to be protected from this disease, have certain high-risk conditions, work with hepatitis A-infected animals, work in hepatitis A research labs, or travel to or work in countries with a high rate of hepatitis A should be immunized. Adults who were previously unvaccinated and who anticipate close contact with an international adoptee during the first 60 days after arrival in the [place] from a country with a high rate of hepatitis A should be immunized.  Hepatitis B vaccine. Adults who wish to be protected from this disease, have certain high-risk conditions, may be exposed to blood or other infectious body fluids, are household contacts or sex partners of hepatitis B positive people, are clients or workers in certain care facilities, or travel to or work in countries with a high rate of hepatitis B should be immunized.  Haemophilus influenzae type b (Hib) vaccine. A previously unvaccinated person with asplenia or sickle cell disease or having a scheduled splenectomy should receive 1 dose of Hib vaccine. Regardless of previous immunization, a recipient of a hematopoietic  stem cell transplant should receive a 3-dose series 6-12 months after her successful transplant. Hib vaccine is not recommended for adults with HIV infection. Preventive Services / Frequency Ages 61 to 67 years  Blood pressure check.** / Every 1 to 2 years.  Lipid and cholesterol check.** / Every 5 years beginning at age 74.  Clinical breast exam.** / Every 3 years for women in their 19s and 34s.  BRCA-related cancer risk assessment.** / For women who have family members with a BRCA-related cancer (breast, ovarian, tubal, or peritoneal cancers).  Pap test.** / Every 2 years from ages 53 through 39. Every 3 years starting at age 53 through age 35 or 65 with a history of 3 consecutive normal Pap tests.  HPV screening.** / Every 3 years from ages 70 through ages 86 to 22 with a history of 3 consecutive normal Pap tests.  Hepatitis C blood test.** / For any individual with known risks for hepatitis C.  Skin self-exam. / Monthly.  Influenza vaccine. / Every year.  Tetanus, diphtheria, and acellular pertussis (Tdap, Td) vaccine.** / Consult your health care provider. Pregnant women should receive 1 dose of Tdap vaccine during each pregnancy. 1 dose of Td every 10 years.  Varicella vaccine.** / Consult your health care provider. Pregnant females who do not have evidence of immunity should receive the first dose after pregnancy.  HPV vaccine. / 3 doses over 6 months, if 45 and younger. The vaccine is not recommended for use in pregnant females. However, pregnancy testing is not needed before receiving a dose.  Measles, mumps, rubella (MMR) vaccine.** / You need at least 1 dose of MMR if you were born in 1957 or later. You may also need a 2nd dose. For females of childbearing age, rubella immunity should be determined. If there is no evidence of immunity,  females who are not pregnant should be vaccinated. If there is no evidence of immunity, females who are pregnant should delay immunization until  after pregnancy.  Pneumococcal 13-valent conjugate (PCV13) vaccine.** / Consult your health care provider.  Pneumococcal polysaccharide (PPSV23) vaccine.** / 1 to 2 doses if you smoke cigarettes or if you have certain conditions.  Meningococcal vaccine.** / 1 dose if you are age 40 to 66 years and a Market researcher living in a residence hall, or have one of several medical conditions, you need to get vaccinated against meningococcal disease. You may also need additional booster doses.  Hepatitis A vaccine.** / Consult your health care provider.  Hepatitis B vaccine.** / Consult your health care provider.  Haemophilus influenzae type b (Hib) vaccine.** / Consult your health care provider. Ages 42 to 32 years  Blood pressure check.** / Every 1 to 2 years.  Lipid and cholesterol check.** / Every 5 years beginning at age 58 years.  Lung cancer screening. / Every year if you are aged 55-80 years and have a 30-pack-year history of smoking and currently smoke or have quit within the past 15 years. Yearly screening is stopped once you have quit smoking for at least 15 years or develop a health problem that would prevent you from having lung cancer treatment.  Clinical breast exam.** / Every year after age 25 years.  BRCA-related cancer risk assessment.** / For women who have family members with a BRCA-related cancer (breast, ovarian, tubal, or peritoneal cancers).  Mammogram.** / Every year beginning at age 62 years and continuing for as long as you are in good health. Consult with your health care provider.  Pap test.** / Every 3 years starting at age 77 years through age 37 or 31 years with a history of 3 consecutive normal Pap tests.  HPV screening.** / Every 3 years from ages 69 years through ages 60 to 64 years with a history of 3 consecutive normal Pap tests.  Fecal occult blood test (FOBT) of stool. / Every year beginning at age 6 years and continuing until age 55 years.  You may not need to do this test if you get a colonoscopy every 10 years.  Flexible sigmoidoscopy or colonoscopy.** / Every 5 years for a flexible sigmoidoscopy or every 10 years for a colonoscopy beginning at age 81 years and continuing until age 31 years.  Hepatitis C blood test.** / For all people born from [birth year] through 1964 and any individual with known risks for hepatitis C.  Skin self-exam. / Monthly.  Influenza vaccine. / Every year.  Tetanus, diphtheria, and acellular pertussis (Tdap/Td) vaccine.** / Consult your health care provider. Pregnant women should receive 1 dose of Tdap vaccine during each pregnancy. 1 dose of Td every 10 years.  Varicella vaccine.** / Consult your health care provider. Pregnant females who do not have evidence of immunity should receive the first dose after pregnancy.  Zoster vaccine.** / 1 dose for adults aged 97 years or older.  Measles, mumps, rubella (MMR) vaccine.** / You need at least 1 dose of MMR if you were born in 1957 or later. You may also need a 2nd dose. For females of childbearing age, rubella immunity should be determined. If there is no evidence of immunity, females who are not pregnant should be vaccinated. If there is no evidence of immunity, females who are pregnant should delay immunization until after pregnancy.  Pneumococcal 13-valent conjugate (PCV13) vaccine.** / Consult your health care provider.  Pneumococcal polysaccharide (  PPSV23) vaccine.** / 1 to 2 doses if you smoke cigarettes or if you have certain conditions.  Meningococcal vaccine.** / Consult your health care provider.  Hepatitis A vaccine.** / Consult your health care provider.  Hepatitis B vaccine.** / Consult your health care provider.  Haemophilus influenzae type b (Hib) vaccine.** / Consult your health care provider. Ages 53 years and over  Blood pressure check.** / Every 1 to 2 years.  Lipid and cholesterol check.** / Every 5 years beginning at age 56  years.  Lung cancer screening. / Every year if you are aged 61-80 years and have a 30-pack-year history of smoking and currently smoke or have quit within the past 15 years. Yearly screening is stopped once you have quit smoking for at least 15 years or develop a health problem that would prevent you from having lung cancer treatment.  Clinical breast exam.** / Every year after age 75 years.  BRCA-related cancer risk assessment.** / For women who have family members with a BRCA-related cancer (breast, ovarian, tubal, or peritoneal cancers).  Mammogram.** / Every year beginning at age 27 years and continuing for as long as you are in good health. Consult with your health care provider.  Pap test.** / Every 3 years starting at age 59 years through age 89 or 7 years with 3 consecutive normal Pap tests. Testing can be stopped between 65 and 70 years with 3 consecutive normal Pap tests and no abnormal Pap or HPV tests in the past 10 years.  HPV screening.** / Every 3 years from ages 51 years through ages 71 or 16 years with a history of 3 consecutive normal Pap tests. Testing can be stopped between 65 and 70 years with 3 consecutive normal Pap tests and no abnormal Pap or HPV tests in the past 10 years.  Fecal occult blood test (FOBT) of stool. / Every year beginning at age 60 years and continuing until age 52 years. You may not need to do this test if you get a colonoscopy every 10 years.  Flexible sigmoidoscopy or colonoscopy.** / Every 5 years for a flexible sigmoidoscopy or every 10 years for a colonoscopy beginning at age 5 years and continuing until age 61 years.  Hepatitis C blood test.** / For all people born from [birth year] through 1965 and any individual with known risks for hepatitis C.  Osteoporosis screening.** / A one-time screening for women ages 67 years and over and women at risk for fractures or osteoporosis.  Skin self-exam. / Monthly.  Influenza vaccine. / Every year.  Tetanus,  diphtheria, and acellular pertussis (Tdap/Td) vaccine.** / 1 dose of Td every 10 years.  Varicella vaccine.** / Consult your health care provider.  Zoster vaccine.** / 1 dose for adults aged 42 years or older.  Pneumococcal 13-valent conjugate (PCV13) vaccine.** / Consult your health care provider.  Pneumococcal polysaccharide (PPSV23) vaccine.** / 1 dose for all adults aged 78 years and older.  Meningococcal vaccine.** / Consult your health care provider.  Hepatitis A vaccine.** / Consult your health care provider.  Hepatitis B vaccine.** / Consult your health care provider.  Haemophilus influenzae type b (Hib) vaccine.** / Consult your health care provider. ** Family history and personal history of risk and conditions may change your health care provider's recommendations. Document Released: 05/30/2001 Document Revised: 08/18/2013 Document Reviewed: 08/29/2010 [facility] Patient Information 2015 [place], [place]. This information is not intended to replace advice given to you by your health care provider. Make sure you discuss any questions  you have with your health care provider.

## 2014-09-08 NOTE — Assessment & Plan Note (Signed)
asymptomatic

## 2014-09-09 ENCOUNTER — Telehealth: Payer: Self-pay

## 2014-09-09 ENCOUNTER — Encounter: Payer: Self-pay | Admitting: Physician Assistant

## 2014-09-09 NOTE — Telephone Encounter (Signed)
Pt notified verbalized understanding. No questions or concerns at this time 

## 2014-09-09 NOTE — Telephone Encounter (Signed)
-----   Message from Stacey A Blyth, MD sent at 09/08/2014 11:27 PM EDT ----- Notify labs normal 

## 2014-09-09 NOTE — Telephone Encounter (Signed)
-----   Message from Bradd CanaryStacey A Blyth, MD sent at 09/08/2014 11:27 PM EDT ----- Notify labs normal

## 2014-09-10 LAB — URINE CULTURE: Colony Count: 60000

## 2014-09-11 ENCOUNTER — Telehealth: Payer: Self-pay | Admitting: Family Medicine

## 2014-09-11 ENCOUNTER — Other Ambulatory Visit: Payer: Self-pay | Admitting: Family Medicine

## 2014-09-11 MED ORDER — NITROFURANTOIN MONOHYD MACRO 100 MG PO CAPS: 100.0000 mg | ORAL_CAPSULE | Freq: Two times a day (BID) | ORAL | Status: DC

## 2014-09-11 NOTE — Progress Notes (Signed)
Quick Note:  Notify UTI Ecoli she needs Macrobid 100 mg po bid x 7 days ______

## 2014-09-11 NOTE — Telephone Encounter (Signed)
Based on results of U/S the patient would like to know if they nodules could be the cause of the cough she has had for a long time?

## 2014-09-11 NOTE — Telephone Encounter (Signed)
Patient informed of PCP response to question.

## 2014-09-11 NOTE — Telephone Encounter (Signed)
Unlikely the cause of the cough given how small they are. Usually bigger before they cause symptoms

## 2014-09-18 ENCOUNTER — Telehealth: Payer: Self-pay | Admitting: Family Medicine

## 2014-09-18 NOTE — Telephone Encounter (Signed)
Caller name: Julian ReilCarol Chiquito Relationship to patient: self Can be reached: [phone], cell Pharmacy: [facility] on [address]  Reason for call:  1. Referral for WF for chronic cough and vocal cord nodules, Ph# [phone] 2. Pt still having trouble sleeping after following your advice, can something be called in for her? 3. Pt states she needs referral for OBGYN 4. Pt states that she needs albuterol rescue inhaler for chronic cough

## 2014-09-19 ENCOUNTER — Encounter: Payer: Self-pay | Admitting: Family Medicine

## 2014-09-19 ENCOUNTER — Other Ambulatory Visit: Payer: Self-pay | Admitting: Family Medicine

## 2014-09-19 DIAGNOSIS — Z124 Encounter for screening for malignant neoplasm of cervix: Secondary | ICD-10-CM

## 2014-09-19 DIAGNOSIS — G4700 Insomnia, unspecified: Secondary | ICD-10-CM

## 2014-09-19 DIAGNOSIS — E042 Nontoxic multinodular goiter: Secondary | ICD-10-CM

## 2014-09-19 DIAGNOSIS — R059 Cough, unspecified: Secondary | ICD-10-CM

## 2014-09-19 DIAGNOSIS — R05 Cough: Secondary | ICD-10-CM

## 2014-09-19 DIAGNOSIS — K219 Gastro-esophageal reflux disease without esophagitis: Secondary | ICD-10-CM

## 2014-09-19 HISTORY — DX: Insomnia, unspecified: G47.00

## 2014-09-19 HISTORY — DX: Gastro-esophageal reflux disease without esophagitis: K21.9

## 2014-09-19 HISTORY — DX: Nontoxic multinodular goiter: E04.2

## 2014-09-19 NOTE — Assessment & Plan Note (Signed)
Proceed with thyroid ultrasound

## 2014-09-19 NOTE — Assessment & Plan Note (Signed)
Likely multifactorial, continue acid suppression and start Zyrtec report persistent symptoms

## 2014-09-19 NOTE — Assessment & Plan Note (Signed)
Proceed with CXR

## 2014-09-19 NOTE — Assessment & Plan Note (Signed)
Avoid offending foods, start probiotics. Do not eat large meals in late evening and consider raising head of bed.  

## 2014-09-19 NOTE — Assessment & Plan Note (Signed)
Urine culture reveals EColi UTI, started on Macrobid and probiotics

## 2014-09-19 NOTE — Telephone Encounter (Signed)
I placed referral to ENT and OB/GYN (I assume for cervical cancer screen). Also OK to send in Albuterol  Inhaler 2 puffs po q 6 hours prn sob, wheeze, cough. Disp #1 with 2 rf. Cannot call in a sleep medicine without seeing patient, as these are controlled. Can try Melatonin 2-10 mg qhs

## 2014-09-19 NOTE — Progress Notes (Signed)
Patricia Roy  [id number] November 30, 1960 09/19/2014      Progress Note-Follow Up  Subjective  Chief Complaint  Chief Complaint  Patient presents with  . Establish Care    HPI  Patient is a 54 y.o. female in today for routine medical care. Patient is in today to establish care. She has a long history of chronic cough. She has had this worked up over many years. She has seen an allergist and gets routine allergy shots. Dr. Arlyss Gandy. She also sees Dr. Sharee Pimple of pulmonology. She has seen 2 different 81 in T doctors including one at [facility] with diagnosed her with nodules. She also notes intermittent trouble with postnasal drip and heartburn. Denies dysphasia. Does note some mild abdominal discomfort and urinary frequency. Denies CP/palp/SOB/HA/congestion/fevers/GI c/o. Taking meds as prescribed  Past Medical History  Diagnosis Date  . Asthma   . Cough   . GERD (gastroesophageal reflux disease)   . Depression   . Blood stool     Hemorrhoids  . Urine incontinence   . UTI (urinary tract infection)   . Chronic cough 09/08/2014  . Rectal bleeding 09/08/2014  . 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 Hx: UTI (urinary tract infection) 09/08/2014  . Insomnia 09/19/2014  . Esophageal reflux 09/19/2014    Past Surgical History  Procedure Laterality Date  . Tonsillectomy    . Child birth  20, 2000 and 2004    stitches each time    Family History  Problem Relation Age of Onset  . Heart disease Mother     H/O valve replacement at 56  . Stroke Mother   . Pulmonary embolism Mother   . Cancer Father     Pancreatic cancer  . Alcohol abuse Father   . Alzheimer's disease Maternal Aunt   . Other Sister     chronic lyme  . Pulmonary embolism Brother     DVTs  . Alcohol abuse Brother     recovering  . Migraines Daughter   . Alcohol abuse Brother     recovering  . Alcohol abuse Sister     demenita    History   Social History  . Marital Status: Married    Spouse Name:  N/A  . Number of Children: 3  . Years of Education: 16   Occupational History  . Utilization Review for [employer]    Social History Main Topics  . Smoking status: Current Some Day Smoker -- 0.50 packs/day  . Smokeless tobacco: Never Used  . Alcohol Use: 0.0 oz/week    0 Standard drinks or equivalent per week     Comment: Occasional  . Drug Use: No  . 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 Not on file   Social History Narrative    Current Outpatient Prescriptions on File Prior to Visit  Medication Sig Dispense Refill  . esomeprazole (NEXIUM) 20 MG capsule Take 20 mg by mouth daily at 12 noon.    . fexofenadine (ALLEGRA) 30 MG tablet Take 30 mg by mouth as needed.    Marland Kitchen ketoconazole (NIZORAL) 2 % cream   3  . triamcinolone cream (KENALOG) 0.1 %   3   No current facility-administered medications on file prior to visit.    Allergies  Allergen Reactions  . Paroxetine   . Sulfonamide Derivatives     Review of Systems  Review of Systems  Constitutional: Negative for fever, chills and malaise/fatigue.  HENT: Positive for congestion. Negative for hearing loss and nosebleeds.   Eyes: Negative for discharge.  Respiratory: Positive for cough. Negative for sputum production, shortness of breath and wheezing.   Cardiovascular: Negative for chest pain, palpitations and leg swelling.  Gastrointestinal: Negative for heartburn, nausea, vomiting, abdominal pain, diarrhea and blood in stool.  Genitourinary: Negative for dysuria, urgency, frequency and hematuria.  Musculoskeletal: Negative for myalgias, back pain and falls.  Skin: Negative for rash.  Neurological: Negative for dizziness, tremors, sensory change, focal weakness, loss of consciousness, weakness and headaches.  Endo/Heme/Allergies: Negative for polydipsia. Does not bruise/bleed easily.    Psychiatric/Behavioral: Negative for depression and suicidal ideas. The patient is not nervous/anxious and does not have insomnia.     Objective  BP 120/82 mmHg  Pulse 84  Temp(Src) 97.5 F (36.4 C) (Oral)  Ht  (1.626 m)  Wt 147 lb 2 oz (66.735 kg)  BMI 25.24 kg/m2  SpO2 96%  LMP 09/08/2011  Physical Exam  Physical Exam  Constitutional: She is oriented to person, place, and time and well-developed, well-nourished, and in no distress. No distress.  HENT:  Head: Normocephalic and atraumatic.  Eyes: Conjunctivae are normal.  Neck: Neck supple. No thyromegaly present.  Cardiovascular: Normal rate, regular rhythm and normal heart sounds.   No murmur heard. Pulmonary/Chest: Effort normal and breath sounds normal. She has no wheezes.  Abdominal: She exhibits no distension and no mass.  Musculoskeletal: She exhibits no edema.  Lymphadenopathy:    She has no cervical adenopathy.  Neurological: She is alert and oriented to person, place, and time.  Skin: Skin is warm and dry. No rash noted. She is not diaphoretic.  Psychiatric: Memory, affect and judgment normal.    Lab Results  Component Value Date   TSH 1.07 09/08/2014   Lab Results  Component Value Date   WBC 8.8 09/08/2014   HGB 13.9 09/08/2014   HCT 40.8 09/08/2014   MCV 90.4 09/08/2014   PLT 243.0 09/08/2014   Lab Results  Component Value Date   CREATININE 0.85 09/08/2014   BUN 12 09/08/2014   NA 137 09/08/2014   K 3.8 09/08/2014   CL 103 09/08/2014   CO2 29 09/08/2014   Lab Results  Component Value Date   ALT 16 09/08/2014   AST 19 09/08/2014   ALKPHOS 45 09/08/2014   BILITOT 0.4 09/08/2014   Lab Results  Component Value Date   CHOL 159 09/08/2014   Lab Results  Component Value Date   HDL 65.00 09/08/2014   Lab Results  Component Value Date   LDLCALC 68 09/08/2014   Lab Results  Component Value Date   TRIG 132.0 09/08/2014   Lab Results  Component Value Date   CHOLHDL 2 09/08/2014      Assessment & Plan  Tricuspid regurgitation asymptomatic   Insomnia Encouraged good sleep hygiene such as dark, quiet room. No blue/green glowing lights such as computer screens in bedroom. No alcohol or stimulants in evening. Cut down on caffeine as able. Regular exercise is helpful but not just prior to bed time.    Hx: UTI (urinary tract infection) Urine culture reveals EColi UTI, started on Macrobid and probiotics   Esophageal reflux Avoid offending foods, start probiotics. Do not eat large meals in late evening and consider raising head of bed.    Dyspnea Likely multifactorial, continue acid suppression and start Zyrtec report persistent symptoms   Multiple  thyroid nodules Proceed with thyroid ultrasound   Chronic cough Proceed with CXR

## 2014-09-19 NOTE — Assessment & Plan Note (Signed)
Encouraged good sleep hygiene such as dark, quiet room. No blue/green glowing lights such as computer screens in bedroom. No alcohol or stimulants in evening. Cut down on caffeine as able. Regular exercise is helpful but not just prior to bed time.  

## 2014-09-21 MED ORDER — ALBUTEROL SULFATE HFA 108 (90 BASE) MCG/ACT IN AERS: 2.0000 | INHALATION_SPRAY | Freq: Four times a day (QID) | RESPIRATORY_TRACT | Status: DC | PRN

## 2014-09-21 NOTE — Telephone Encounter (Signed)
Sent in albuterol as instructed and informed the patient of referrals.  The patient stated her sleep problem was discussed at her recent OV.  Melatonin does not work for her.

## 2014-09-21 NOTE — Telephone Encounter (Signed)
So the issue is the sleep meds are not meant to be used routinely and have some habituating potential and some association with dementia in the future. I am willing to let her have Zolpidem 10 mg qhs prn, disp #15 but we have to document her response and have her sign a drug contract for the record to receive this intermittently. This med also has SE of excessive sedation, sleep walking and confusion. These do not happen often but do happen. No driving when sedated

## 2014-09-22 ENCOUNTER — Encounter: Payer: Self-pay | Admitting: Family Medicine

## 2014-09-22 MED ORDER — ZOLPIDEM TARTRATE 10 MG PO TABS: 10.0000 mg | ORAL_TABLET | Freq: Every evening | ORAL | Status: DC | PRN

## 2014-09-22 NOTE — Telephone Encounter (Signed)
Called the patient informed of ALL PCP information/instructions regarding zolpidem prescription.  The patient was very cooperative with instructions and stated she was ok and understood instructions.  Printed contact and attached to hardcopy of zolpidem and put at the front desk.  Did instruct the patient to complete contact and then she would received her prescription to take to her pharmacy.  The patient did verbalize an understanding and agreement of all instructions.

## 2014-09-22 NOTE — Telephone Encounter (Signed)
Printed prescription and called the patient left msg. To call back

## 2014-09-23 ENCOUNTER — Encounter: Payer: Self-pay | Admitting: Family Medicine

## 2014-09-28 ENCOUNTER — Ambulatory Visit (INDEPENDENT_AMBULATORY_CARE_PROVIDER_SITE_OTHER): Payer: Managed Care, Other (non HMO) | Admitting: Physician Assistant

## 2014-09-28 ENCOUNTER — Encounter: Payer: Self-pay | Admitting: Physician Assistant

## 2014-09-28 ENCOUNTER — Encounter (INDEPENDENT_AMBULATORY_CARE_PROVIDER_SITE_OTHER): Payer: Self-pay

## 2014-09-28 VITALS — BP 100/72 | HR 84 | Ht 63.5 in | Wt 145.2 lb

## 2014-09-28 DIAGNOSIS — K629 Disease of anus and rectum, unspecified: Secondary | ICD-10-CM | POA: Diagnosis not present

## 2014-09-28 DIAGNOSIS — K625 Hemorrhage of anus and rectum: Secondary | ICD-10-CM | POA: Diagnosis not present

## 2014-09-28 DIAGNOSIS — Z1211 Encounter for screening for malignant neoplasm of colon: Secondary | ICD-10-CM | POA: Diagnosis not present

## 2014-09-28 MED ORDER — POLYETHYLENE GLYCOL 3350 17 GM/SCOOP PO POWD: ORAL | Status: DC

## 2014-09-28 MED ORDER — HYDROCORTISONE ACE-PRAMOXINE 2.5-1 % EX CREA: 1.0000 "application " | TOPICAL_CREAM | Freq: Three times a day (TID) | CUTANEOUS | Status: DC

## 2014-09-28 NOTE — Patient Instructions (Addendum)
You have been given a separate informational sheet regarding your tobacco use, the importance of quitting and local resources to help you quit. We sent a prescription to [facility] [address] for the generic Miralax for the colonoscopy prep.  You have been scheduled for a colonoscopy. Please follow written instructions given to you at your visit today.  Please pick up your prep supplies at the pharmacy within the next 1-3 days. If you use inhalers (even only as needed), please bring them with you on the day of your procedure. Your physician has requested that you go to www.startemmi.com and enter the access code given to you at your visit today. This web site gives a general overview about your procedure. However, you should still follow specific instructions given to you by our office regarding your preparation for the procedure.

## 2014-09-28 NOTE — Progress Notes (Signed)
Patient ID: Patricia Roy, female   DOB: 02/14/1961, 54 y.o.   MRN: [id number]   Subjective:    Patient ID: Patricia Roy, female    DOB: 1960/08/28, 54 y.o.   MRN: [id number]  HPI Patricia Roy is a pleasant 54 year old white female referred by Dr. Rogelia Rohrer  for evaluation of rectal bleeding. She has not had prior colonoscopy. She is generally in good health has history of a chronic cough which may be reflux related and tricuspid regurgitation. Patient had recent labs done showing hemoglobin of 13.9 hematocrit of 40.8 MCV of 93 and platelets of 243. She says she's had her current symptoms over the past 6-7 months but has had long-term problems with hemorrhoids off and on ever since the birth of her children. She has had a  thrombosed hemorrhoid in the past, and says that her symptoms have "acted up" intermittently. She has felt that her hemorrhoids have been an external. She now notes intermittent bleeding with bowel movements sometimes blood dripping into the commode with bowel movements. She's had a lot of problems with itching and irritation discomfort. No abdominal pain changes in her bowel habits melena or hematochezia. Not having any chronic problems with constipation. Family history is negative for GI diseases far she is aware.  Review of Systems Pertinent positive and negative review of systems were noted in the above HPI section.  All other review of systems was otherwise negative.  Outpatient Encounter Prescriptions as of 09/28/2014  Medication Sig  . albuterol (PROVENTIL HFA;VENTOLIN HFA) 108 (90 BASE) MCG/ACT inhaler Inhale 2 puffs into the lungs every 6 (six) hours as needed for wheezing or shortness of breath.  . esomeprazole (NEXIUM) 20 MG capsule Take 20 mg by mouth daily at 12 noon.  . fexofenadine (ALLEGRA) 30 MG tablet Take 30 mg by mouth as needed.  Marland Kitchen ketoconazole (NIZORAL) 2 % cream   . triamcinolone cream (KENALOG) 0.1 %   . zolpidem (AMBIEN) 10 MG tablet Take 1 tablet (10 mg  total) by mouth at bedtime as needed for sleep.  . polyethylene glycol powder (GLYCOLAX/MIRALAX) powder Take as directed for colonoscopy prep.  . Pramoxine-HC (HYDROCORTISONE ACE-PRAMOXINE) 2.5-1 % CREA Apply 1 application topically 3 (three) times daily.  . [DISCONTINUED] nitrofurantoin, macrocrystal-monohydrate, (MACROBID) 100 MG capsule Take 1 capsule (100 mg total) by mouth 2 (two) times daily. Take for 7 days   No facility-administered encounter medications on file as of 09/28/2014.   Allergies  Allergen Reactions  . Paxil [Paroxetine]   . Sulfonamide Derivatives    Patient Active Problem List   Diagnosis Date Noted  . Insomnia 09/19/2014  . Esophageal reflux 09/19/2014  . Multiple thyroid nodules 09/19/2014  . Chronic cough 09/08/2014  . Rectal bleeding 09/08/2014  . Hx: UTI (urinary tract infection) 09/08/2014  . Pain in lower limb 07/28/2014  . Injury of foot, left, superficial 07/28/2014  . 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 Dyspnea 05/17/2011  . DEPRESSION 01/07/2007  . ASTHMA 01/07/2007   History   Social History  . Marital Status: Married    Spouse Name: N/A  . Number of Children: 3  . Years of Education: 16   Occupational History  . Utilization Review for [employer]    Social History Main Topics  . Smoking status: Current Some Day Smoker -- 0.50 packs/day    Types: Cigarettes  . Smokeless tobacco: Never Used  . Alcohol Use: 0.0 oz/week    0 Standard drinks or equivalent per week     Comment: Occasional  .  Drug Use: No  . 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 Not on file   Social History Narrative    Patricia Roy family history includes Alcohol abuse in her brother, brother, father, and sister; Alzheimer's disease in her maternal aunt; Dementia in her sister; Heart disease in her mother; Migraines in her daughter; Other in her  sister; Pancreatic cancer in her father; Pulmonary embolism in her brother and mother; Stroke in her mother.      Objective:    Filed Vitals:   09/28/14 0852  BP: 100/72  Pulse: 84    Physical Exam  well-developed white female in no acute distress, pleasant blood pressure 100/72 pulse 84 height 5 foot 3 weight 145. HEENT; nontraumatic normocephalic EOMI PERRLA sclera anicteric, Cardiovascular ;regular rate and rhythm with S1-S2, soft murmur, Pulmonary; clear bilaterally, Abdomen ;soft nontender nondistended bowel sounds are active there is no palpable mass or hepatosplenomegaly, Rectal exam she has what appears to be a large external hemorrhoid which is flesh colored and cauliflowered in appearance, nontender. EXt; no clubbing cyanosis or edema skin warm and dry, Psych; mood and affect appropriate     Assessment & Plan:   #1 54 yo female with large external hemorrhoid, symptomatic with intermittent bleeding,discomfort #2 Colon neoplasia screening- #3 chronic cough ? GERD related  Plan; Anamantle cream 2,5 % 3-4 x daily prn Schedule for colonoscopy with Dr. Leone Payor. Procedure discussed in detail with patient and she is agreeable to proceed. If colonoscopy otherwise negative, and on careful inspection this is felt to be an external hemorrhoid may benefit from surgical referral for removal.   Amy S Esterwood PA-C 09/28/2014   Cc: Bradd Canary, MD

## 2014-10-06 NOTE — Progress Notes (Signed)
Agree with Ms. Esterwood's assessment and plan. Gaspar Fowle E. Yamilee Harmes, MD, FACG   

## 2014-11-11 ENCOUNTER — Encounter: Payer: Self-pay | Admitting: Women's Health

## 2014-11-11 ENCOUNTER — Ambulatory Visit (INDEPENDENT_AMBULATORY_CARE_PROVIDER_SITE_OTHER): Payer: Managed Care, Other (non HMO) | Admitting: Women's Health

## 2014-11-11 ENCOUNTER — Other Ambulatory Visit (HOSPITAL_COMMUNITY)
Admission: RE | Admit: 2014-11-11 | Discharge: 2014-11-11 | Disposition: A | Discharge status: Home / Self Care | Payer: Managed Care, Other (non HMO) | Source: Ambulatory Visit | Attending: Women's Health | Admitting: Women's Health

## 2014-11-11 VITALS — BP 122/80 | Ht 64.0 in | Wt 145.0 lb

## 2014-11-11 DIAGNOSIS — Z1151 Encounter for screening for human papillomavirus (HPV): Secondary | ICD-10-CM | POA: Insufficient documentation

## 2014-11-11 DIAGNOSIS — Z01419 Encounter for gynecological examination (general) (routine) without abnormal findings: Secondary | ICD-10-CM | POA: Insufficient documentation

## 2014-11-11 DIAGNOSIS — Z13820 Encounter for screening for osteoporosis: Secondary | ICD-10-CM | POA: Diagnosis not present

## 2014-11-11 NOTE — Progress Notes (Signed)
Patricia Roy 1960-06-13 [id number]    History:    Presents for annual exam.  Had been a patient here at the office, went through a bad divorce, minimal care for years and has recently had annual exam at primary care..Reports normal labs. History of thyroid nodules.  Remarried, denies need for STD screen. Postmenopausal/no bleeding 4 years. History of 1 abnormal Paps in the past with a negative colposcopy. Normal mammogram history.   Has scheduled colonoscopy for August. Smokes occasional cigarettes on weekends.  Past medical history, past surgical history, family history and social history were all reviewed and documented in the EPIC chart. Works for [employer]. 3 daughters ages 23, 55, 86 all doing well.  ROS:  A ROS was performed and pertinent positives and negatives are included.  Exam:  Filed Vitals:   11/11/14 0818  BP: 122/80    General appearance:  Normal Thyroid:  Symmetrical, normal in size, without palpable masses or nodularity. Respiratory  Auscultation:  Clear without wheezing or rhonchi Cardiovascular  Auscultation:  Regular rate, without rubs, murmurs or gallops  Edema/varicosities:  Not grossly evident Abdominal  Soft,nontender, without masses, guarding or rebound.  Liver/spleen:  No organomegaly noted  Hernia:  None appreciated  Skin  Inspection:  Grossly normal   Breasts: Examined lying and sitting.     Right: Without masses, retractions, discharge or axillary adenopathy.     Left: Without masses, retractions, discharge or axillary adenopathy. Gentitourinary   Inguinal/mons:  Normal without inguinal adenopathy  External genitalia:  Normal  BUS/Urethra/Skene's glands:  Normal  Vagina:  Normal  Cervix:  Normal  Uterus:  normal in size, shape and contour.  Midline and mobile  Adnexa/parametria:     Rt: Without masses or tenderness.   Lt: Without masses or tenderness.  Anus and perineum: Hemorrhoids  Digital rectal exam: Normal sphincter tone  without palpated masses or tenderness  Assessment/Plan:  54 y.o. MWF G3 P3 for annual exam.   Postmenopausal with no bleeding/no HRT. Minimal smoking Hemorrhoids with bright red bleeding has follow-up scheduled with [facility] primary care  Plan: SBE's, annual screening mammogram, breast center information given, instructed to schedule. Continue regular exercise, calcium rich diet, vitamin D 1000 daily encouraged. DEXA, instructed to schedule. Reviewed importance of calcium rich diet, weightbearing exercise, home safety. Aware of need to have no smoking. Keep scheduled GI appointment for colonoscopy. UA, Pap with HR HPV typing, new screening guidelines reviewed.    Harrington [facility], 8:59 AM 11/11/2014

## 2014-11-11 NOTE — Patient Instructions (Signed)
Health Recommendations for Postmenopausal Women Respected and ongoing research has looked at the most common causes of death, disability, and poor quality of life in postmenopausal women. The causes include heart disease, diseases of blood vessels, diabetes, depression, cancer, and bone loss (osteoporosis). Many things can be done to help lower the chances of developing these and other common problems. CARDIOVASCULAR DISEASE Heart Disease: A heart attack is a medical emergency. Know the signs and symptoms of a heart attack. Below are things women can do to reduce their risk for heart disease.   Do not smoke. If you smoke, quit.  Aim for a healthy weight. Being overweight causes many preventable deaths. Eat a healthy and balanced diet and drink an adequate amount of liquids.  Get moving. Make a commitment to be more physically active. Aim for 30 minutes of activity on most, if not all days of the week.  Eat for heart health. Choose a diet that is low in saturated fat and cholesterol and eliminate trans fat. Include whole grains, vegetables, and fruits. Read and understand the labels on food containers before buying.  Know your numbers. Ask your caregiver to check your blood pressure, cholesterol (total, HDL, LDL, triglycerides) and blood glucose. Work with your caregiver on improving your entire clinical picture.  High blood pressure. Limit or stop your table salt intake (try salt substitute and food seasonings). Avoid salty foods and drinks. Read labels on food containers before buying. Eating well and exercising can help control high blood pressure. STROKE  Stroke is a medical emergency. Stroke may be the result of a blood clot in a blood vessel in the brain or by a brain hemorrhage (bleeding). Know the signs and symptoms of a stroke. To lower the risk of developing a stroke:  Avoid fatty foods.  Quit smoking.  Control your diabetes, blood pressure, and irregular heart rate. THROMBOPHLEBITIS  (BLOOD CLOT) OF THE LEG  Becoming overweight and leading a stationary lifestyle may also contribute to developing blood clots. Controlling your diet and exercising will help lower the risk of developing blood clots. CANCER SCREENING  Breast Cancer: Take steps to reduce your risk of breast cancer.  You should practice "breast self-awareness." This means understanding the normal appearance and feel of your breasts and should include breast self-examination. Any changes detected, no matter how small, should be reported to your caregiver.  After age 40, you should have a clinical breast exam (CBE) every year.  Starting at age 40, you should consider having a mammogram (breast X-ray) every year.  If you have a family history of breast cancer, talk to your caregiver about genetic screening.  If you are at high risk for breast cancer, talk to your caregiver about having an MRI and a mammogram every year.  Intestinal or Stomach Cancer: Tests to consider are a rectal exam, fecal occult blood, sigmoidoscopy, and colonoscopy. Women who are high risk may need to be screened at an earlier age and more often.  Cervical Cancer:  Beginning at age 30, you should have a Pap test every 3 years as long as the past 3 Pap tests have been normal.  If you have had past treatment for cervical cancer or a condition that could lead to cancer, you need Pap tests and screening for cancer for at least 20 years after your treatment.  If you had a hysterectomy for a problem that was not cancer or a condition that could lead to cancer, then you no longer need Pap tests.    If you are between ages 65 and 70, and you have had normal Pap tests going back 10 years, you no longer need Pap tests.  If Pap tests have been discontinued, risk factors (such as a new sexual partner) need to be reassessed to determine if screening should be resumed.  Some medical problems can increase the chance of getting cervical cancer. In these  cases, your caregiver may recommend more frequent screening and Pap tests.  Uterine Cancer: If you have vaginal bleeding after reaching menopause, you should notify your caregiver.  Ovarian Cancer: Other than yearly pelvic exams, there are no reliable tests available to screen for ovarian cancer at this time except for yearly pelvic exams.  Lung Cancer: Yearly chest X-rays can detect lung cancer and should be done on high risk women, such as cigarette smokers and women with chronic lung disease (emphysema).  Skin Cancer: A complete body skin exam should be done at your yearly examination. Avoid overexposure to the sun and ultraviolet light lamps. Use a strong sun block cream when in the sun. All of these things are important for lowering the risk of skin cancer. MENOPAUSE Menopause Symptoms: Hormone therapy products are effective for treating symptoms associated with menopause:  Moderate to severe hot flashes.  Night sweats.  Mood swings.  Headaches.  Tiredness.  Loss of sex drive.  Insomnia.  Other symptoms. Hormone replacement carries certain risks, especially in older women. Women who use or are thinking about using estrogen or estrogen with progestin treatments should discuss that with their caregiver. Your caregiver will help you understand the benefits and risks. The ideal dose of hormone replacement therapy is not known. The Food and Drug Administration (FDA) has concluded that hormone therapy should be used only at the lowest doses and for the shortest amount of time to reach treatment goals.  OSTEOPOROSIS Protecting Against Bone Loss and Preventing Fracture If you use hormone therapy for prevention of bone loss (osteoporosis), the risks for bone loss must outweigh the risk of the therapy. Ask your caregiver about other medications known to be safe and effective for preventing bone loss and fractures. To guard against bone loss or fractures, the following is recommended:  If  you are younger than age 50, take 1000 mg of calcium and at least 600 mg of Vitamin D per day.  If you are older than age 50 but younger than age 70, take 1200 mg of calcium and at least 600 mg of Vitamin D per day.  If you are older than age 70, take 1200 mg of calcium and at least 800 mg of Vitamin D per day. Smoking and excessive alcohol intake increases the risk of osteoporosis. Eat foods rich in calcium and vitamin D and do weight bearing exercises several times a week as your caregiver suggests. DIABETES Diabetes Mellitus: If you have type I or type 2 diabetes, you should keep your blood sugar under control with diet, exercise, and recommended medication. Avoid starchy and fatty foods, and too many sweets. Being overweight can make diabetes control more difficult. COGNITION AND MEMORY Cognition and Memory: Menopausal hormone therapy is not recommended for the prevention of cognitive disorders such as Alzheimer's disease or memory loss.  DEPRESSION  Depression may occur at any age, but it is common in elderly women. This may be because of physical, medical, social (loneliness), or financial problems and needs. If you are experiencing depression because of medical problems and control of symptoms, talk to your caregiver about this. Physical   activity and exercise may help with mood and sleep. Community and volunteer involvement may improve your sense of value and worth. If you have depression and you feel that the problem is getting worse or becoming severe, talk to your caregiver about which treatment options are best for you. ACCIDENTS  Accidents are common and can be serious in elderly woman. Prepare your house to prevent accidents. Eliminate throw rugs, place hand bars in bath, shower, and toilet areas. Avoid wearing high heeled shoes or walking on wet, snowy, and icy areas. Limit or stop driving if you have vision or hearing problems, or if you feel you are unsteady with your movements and  reflexes. HEPATITIS C Hepatitis C is a type of viral infection affecting the liver. It is spread mainly through contact with blood from an infected person. It can be treated, but if left untreated, it can lead to severe liver damage over the years. Many people who are infected do not know that the virus is in their blood. If you are a "baby-boomer", it is recommended that you have one screening test for Hepatitis C. IMMUNIZATIONS  Several immunizations are important to consider having during your senior years, including:   Tetanus, diphtheria, and pertussis booster shot.  Influenza every year before the flu season begins.  Pneumonia vaccine.  Shingles vaccine.  Others, as indicated based on your specific needs. Talk to your caregiver about these. Document Released: 05/26/2005 Document Revised: 08/18/2013 Document Reviewed: 01/20/2008 Exit[employer] Patient Information 2015 [employer]. This information is not intended to replace advice given to you by your health care provider. Make sure you discuss any questions you have with your health care provider.  

## 2014-11-12 LAB — URINALYSIS W MICROSCOPIC + REFLEX CULTURE
Bacteria, UA: NONE SEEN [HPF]
Bilirubin Urine: NEGATIVE
Casts: NONE SEEN [LPF]
Crystals: NONE SEEN [HPF]
Glucose, UA: NEGATIVE
Hgb urine dipstick: NEGATIVE
Ketones, ur: NEGATIVE
Leukocytes, UA: NEGATIVE
Nitrite: NEGATIVE
Protein, ur: NEGATIVE
RBC / HPF: NONE SEEN RBC/HPF (ref <=2)
Specific Gravity, Urine: 1.01 (ref 1.001–1.035)
WBC, UA: NONE SEEN WBC/HPF (ref <=5)
Yeast: NONE SEEN [HPF]
pH: 7 (ref 5.0–8.0)

## 2014-11-12 LAB — CYTOLOGY - PAP

## 2014-11-23 ENCOUNTER — Other Ambulatory Visit: Payer: Self-pay | Admitting: Family Medicine

## 2014-11-23 MED ORDER — ZOLPIDEM TARTRATE 10 MG PO TABS: 10.0000 mg | ORAL_TABLET | Freq: Every evening | ORAL | Status: DC | PRN

## 2014-11-23 NOTE — Telephone Encounter (Signed)
Can be reached:[phone] Pharmacy: [facility] on [address]  Reason for call: Pt would like refill on ambien if possible.

## 2014-11-23 NOTE — Telephone Encounter (Signed)
Requesting: Ambien Contract  None UDS  None Last OV  09/08/14 Last Refill  #15 with 0 refills 09/22/14  Please Advise

## 2014-11-23 NOTE — Telephone Encounter (Signed)
Faxed hardcopy for Zolpidem to [employer] [facility]

## 2014-12-02 ENCOUNTER — Ambulatory Visit (AMBULATORY_SURGERY_CENTER): Payer: Managed Care, Other (non HMO) | Admitting: Internal Medicine

## 2014-12-02 ENCOUNTER — Telehealth: Payer: Self-pay

## 2014-12-02 ENCOUNTER — Encounter: Payer: Self-pay | Admitting: Internal Medicine

## 2014-12-02 VITALS — BP 120/59 | HR 75 | Temp 96.6°F | Resp 30 | Ht 63.0 in | Wt 145.0 lb

## 2014-12-02 DIAGNOSIS — K644 Residual hemorrhoidal skin tags: Secondary | ICD-10-CM

## 2014-12-02 DIAGNOSIS — Z1211 Encounter for screening for malignant neoplasm of colon: Secondary | ICD-10-CM

## 2014-12-02 MED ORDER — SODIUM CHLORIDE 0.9 % IV SOLN: 500.0000 mL | INTRAVENOUS | Status: DC

## 2014-12-02 NOTE — Progress Notes (Signed)
Transferred to recovery room. A/O x3, pleased with MAC.  VSS.  Report to Wendy, RN. 

## 2014-12-02 NOTE — Telephone Encounter (Signed)
Patient is notified of surgical referral for anal polyp/external hemorrhoid.  She is scheduled for 12/29/14 with Dr. Maisie Fus at [facility] for 8:30 arrival.

## 2014-12-02 NOTE — Op Note (Signed)
[facility] [address] [place], [address]   COLONOSCOPY PROCEDURE REPORT  PATIENT: Patricia Roy, Patricia Roy  MR#: [id number] BIRTHDATE: 11/16/1960 , 53  yrs. old GENDER: female ENDOSCOPIST: Iva Boop, MD, [facility] PROCEDURE DATE:  12/02/2014 PROCEDURE:   Colonoscopy, screening First Screening Colonoscopy - Avg.  risk and is 50 yrs.  old or older Yes.  Prior Negative Screening - Now for repeat screening. N/A  History of Adenoma - Now for follow-up colonoscopy & has been > or = to 3 yrs.  N/A  Polyps removed today? No Recommend repeat exam, <10 yrs? No ASA CLASS:   Class I INDICATIONS:Screening for colonic neoplasia and Colorectal Neoplasm Risk Assessment for this procedure is average risk. MEDICATIONS: Propofol 350 mg IV and Monitored anesthesia care  DESCRIPTION OF PROCEDURE:   After the risks benefits and alternatives of the procedure were thoroughly explained, informed consent was obtained.  The digital rectal exam revealed external hemorrhoids.   The LB [id number]  endoscope was introduced through the anus and advanced to the cecum, which was identified by both the appendix and ileocecal valve. No adverse events experienced.   The quality of the prep was excellent.  (MiraLax was used)  The instrument was then slowly withdrawn as the colon was fully examined. Estimated blood loss is zero unless otherwise noted in this procedure report.      COLON FINDINGS: A normal appearing cecum, ileocecal valve, and appendiceal orifice were identified.  The ascending, transverse, descending, sigmoid colon, and rectum appeared unremarkable. Large external hemorrhoid/anal polyp was found.  Retroflexed views revealed external hemorrhoids. The time to cecum = 5.6 Withdrawal time = 7.9   The scope was withdrawn and the procedure completed. COMPLICATIONS: There were no immediate complications.  ENDOSCOPIC IMPRESSION: 1.   Normal colonoscopy 2.   Large external  hemorrhoid/anal polyp- right posterior  RECOMMENDATIONS: 1.  Repeat colonoscopy 10 years. 2.  Surgical referral to Dr.  Maisie Fus re: external hemorrhoid/anal polyp  eSigned:  Iva Boop, MD, [facility] 12/02/2014 1:55 PM   cc: The Patient      Reuel Derby, MD

## 2014-12-02 NOTE — Patient Instructions (Addendum)
I did not find any polyps. You should have the chronically inflamed hemorrhoid removed by a surgeon. I can make a referral.  Next routine colonoscopy in 10 years - 2026  I appreciate the opportunity to care for you. Iva Boop, MD, FACG  YOU HAD AN ENDOSCOPIC PROCEDURE TODAY AT THE [facility]:   Refer to the procedure report that was given to you for any specific questions about what was found during the examination.  If the procedure report does not answer your questions, please call your gastroenterologist to clarify.  If you requested that your care partner not be given the details of your procedure findings, then the procedure report has been included in a sealed envelope for you to review at your convenience later.  YOU SHOULD EXPECT: Some feelings of bloating in the abdomen. Passage of more gas than usual.  Walking can help get rid of the air that was put into your GI tract during the procedure and reduce the bloating. If you had a lower endoscopy (such as a colonoscopy or flexible sigmoidoscopy) you may notice spotting of blood in your stool or on the toilet paper. If you underwent a bowel prep for your procedure, you may not have a normal bowel movement for a few days.  Please Note:  You might notice some irritation and congestion in your nose or some drainage.  This is from the oxygen used during your procedure.  There is no need for concern and it should clear up in a day or so.  SYMPTOMS TO REPORT IMMEDIATELY:   Following lower endoscopy (colonoscopy or flexible sigmoidoscopy):  Excessive amounts of blood in the stool  Significant tenderness or worsening of abdominal pains  Swelling of the abdomen that is new, acute  Fever of 100F or higher   For urgent or emergent issues, a gastroenterologist can be reached at any hour by calling [phone].   DIET: Your first meal following the procedure should be a small meal and then it is ok to progress to  your normal diet. Heavy or fried foods are harder to digest and may make you feel nauseous or bloated.  Likewise, meals heavy in dairy and vegetables can increase bloating.  Drink plenty of fluids but you should avoid alcoholic beverages for 24 hours.  ACTIVITY:  You should plan to take it easy for the rest of today and you should NOT DRIVE or use heavy machinery until tomorrow (because of the sedation medicines used during the test).    FOLLOW UP: Our staff will call the number listed on your records the next business day following your procedure to check on you and address any questions or concerns that you may have regarding the information given to you following your procedure. If we do not reach you, we will leave a message.  However, if you are feeling well and you are not experiencing any problems, there is no need to return our call.  We will assume that you have returned to your regular daily activities without incident.  If any biopsies were taken you will be contacted by phone or by letter within the next 1-3 weeks.  Please call us at [phone] if you have not heard about the biopsies in 3 weeks.    SIGNATURES/CONFIDENTIALITY: You and/or your care partner have signed paperwork which will be entered into your electronic medical record.  These signatures attest to the fact that that the information above on your After Visit Summary  has been reviewed and is understood.  Full responsibility of the confidentiality of this discharge information lies with you and/or your care-partner.  Next colonoscopy in 10 years. Office will call to set up appointment with Dr. Maisie Fus

## 2014-12-03 ENCOUNTER — Telehealth: Payer: Self-pay | Admitting: *Deleted

## 2014-12-03 NOTE — Telephone Encounter (Signed)
  Follow up Call-  Call back number 12/02/2014  Post procedure Call Back phone  # [phone]  Permission to leave phone message Yes     Patient questions:  Do you have a fever, pain , or abdominal swelling? No. Pain Score  0 *  Have you tolerated food without any problems? Yes.    Have you been able to return to your normal activities? Yes.    Do you have any questions about your discharge instructions: Diet   No. Medications  No. Follow up visit  No.  Do you have questions or concerns about your Care? No.  Actions: * If pain score is 4 or above: No action needed, pain <4.

## 2014-12-14 ENCOUNTER — Ambulatory Visit (INDEPENDENT_AMBULATORY_CARE_PROVIDER_SITE_OTHER): Payer: Managed Care, Other (non HMO) | Admitting: Family Medicine

## 2014-12-14 ENCOUNTER — Encounter: Payer: Self-pay | Admitting: Family Medicine

## 2014-12-14 VITALS — BP 116/71 | HR 82 | Temp 98.2°F | Ht 64.0 in | Wt 146.0 lb

## 2014-12-14 DIAGNOSIS — K648 Other hemorrhoids: Secondary | ICD-10-CM

## 2014-12-14 DIAGNOSIS — K219 Gastro-esophageal reflux disease without esophagitis: Secondary | ICD-10-CM | POA: Diagnosis not present

## 2014-12-14 DIAGNOSIS — R05 Cough: Secondary | ICD-10-CM | POA: Diagnosis not present

## 2014-12-14 DIAGNOSIS — G4700 Insomnia, unspecified: Secondary | ICD-10-CM

## 2014-12-14 DIAGNOSIS — R053 Chronic cough: Secondary | ICD-10-CM

## 2014-12-14 DIAGNOSIS — E042 Nontoxic multinodular goiter: Secondary | ICD-10-CM

## 2014-12-14 NOTE — Assessment & Plan Note (Signed)
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 Started on Pantoprazole bid for now and will titrate down. Is questioning if Pantoprazole is worsening her insomnia. May try one week of stopping Pantoprazole in eve for a week and substituting Zantac as needed. Try Elderberry and ginger to sooth throat

## 2014-12-14 NOTE — Progress Notes (Signed)
Pre visit review using our clinic review tool, if applicable. No additional management support is needed unless otherwise documented below in the visit note. 

## 2014-12-14 NOTE — Assessment & Plan Note (Signed)
Encouraged good sleep hygiene such as dark, quiet room. No blue/green glowing lights such as computer screens in bedroom. No alcohol or stimulants in evening. Cut down on caffeine as able. Regular exercise is helpful but not just prior to bed time. Refill Ambien as needed

## 2014-12-14 NOTE — Assessment & Plan Note (Signed)
Repeat ultrasound of thyroid at 6 month mark

## 2014-12-14 NOTE — Assessment & Plan Note (Signed)
Has appt with Dr Romie Levee, surgery to have consultation to have it removed on 9/13. Asymptomatic at this time. Maintain fiber and fluids, start probiotic

## 2014-12-14 NOTE — Assessment & Plan Note (Signed)
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 Started on Pantoprazole bid for now and will titrate down. Is questioning if Pantoprazole is worsening her insomnia. May try one week of stopping Pantoprazole in eve for a week and substituting Zantac as needed. Try Elderberry and ginger to sooth throat. Avoid offending foods, start probiotics. Do not eat large meals in late evening and consider raising head of bed.

## 2014-12-14 NOTE — Progress Notes (Signed)
Patient ID: Patricia Roy, female   DOB: 03/11/1961, 54 y.o.   MRN: [id number]   Subjective:    Patient ID: Patricia Roy, female    DOB: 1960-08-18, 54 y.o.   MRN: [id number]  Chief Complaint  Patient presents with  . Follow-up    HPI Patient is in today for follow-up on numerous concerns.Continues to struggle with chronic cough that id generally nonproductive. Denies any significant heartburn but acknowledges that cough is worse with lying down. No recent congestion or new acute illness. Denies CP/palp/SOB/HA/congestion/fevers/GI or GU c/o. Taking meds as prescribed  Past Medical History  Diagnosis Date  . Blood stool     Hemorrhoids  . Urine incontinence   . Chronic cough 09/08/2014  . Rectal bleeding 09/08/2014  . 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 Hx: UTI (urinary tract infection) 09/08/2014  . Insomnia 09/19/2014  . Esophageal reflux 09/19/2014  . Multiple thyroid nodules 09/19/2014    and vocal cord nodules    Past Surgical History  Procedure Laterality Date  . Tonsillectomy    . Child birth  67, 2000 and 2004    stitches each time    Family History  Problem Relation Age of Onset  . Heart disease Mother     H/O valve replacement at 51  . Stroke Mother   . Pulmonary embolism Mother   . Pancreatic cancer Father   . Alcohol abuse Father   . Alzheimer's disease Maternal Aunt   . Other Sister     chronic lyme  . Pulmonary embolism Brother     DVTs  . Alcohol abuse Brother     recovering  . Migraines Daughter   . Alcohol abuse Brother     recovering  . Alcohol abuse Sister     dementia  . Dementia Sister     Social History   Social History  . Marital Status: Married    Spouse Name: N/A  . Number of Children: 3  . Years of Education: 16   Occupational History  . Utilization Review for [employer]    Social History Main Topics  . Smoking status: Current Some Day Smoker -- 0.50 packs/day    Types: Cigarettes  . Smokeless tobacco: Never Used  . Alcohol Use:  0.0 oz/week    0 Standard drinks or equivalent per week     Comment: Occasional  . Drug Use: No  . Sexual Activity: Yes     Comment: lives with husband (heart diseaseand duaghters, no dietary restrictions, works ortho [employer],...INTERCOURSE AGE 75, SEXUALPARTNERS LESS THAN 5   Other Topics Concern  . Not on file   Social History Narrative    Outpatient Prescriptions Prior to Visit  Medication Sig Dispense Refill  . albuterol (PROVENTIL HFA;VENTOLIN HFA) 108 (90 BASE) MCG/ACT inhaler Inhale 2 puffs into the lungs every 6 (six) hours as needed for wheezing or shortness of breath. 1 Inhaler 3  . esomeprazole (NEXIUM) 20 MG capsule Take 20 mg by mouth daily at 12 noon.    . fexofenadine (ALLEGRA) 30 MG tablet Take 30 mg by mouth as needed.    Marland Kitchen ketoconazole (NIZORAL) 2 % cream   3  . pantoprazole (PROTONIX) 40 MG tablet Take 40 mg by mouth 2 (two) times daily before a meal.     . Pramoxine-HC (HYDROCORTISONE ACE-PRAMOXINE) 2.5-1 % CREA Apply 1 application topically 3 (three) times daily. 1 Tube 1  . triamcinolone cream (KENALOG) 0.1 %   3  . zolpidem (AMBIEN) 10  MG tablet Take 1 tablet (10 mg total) by mouth at bedtime as needed for sleep. 15 tablet 0   No facility-administered medications prior to visit.    Allergies  Allergen Reactions  . Paxil [Paroxetine]   . Sulfonamide Derivatives     Review of Systems  Constitutional: Negative for fever and malaise/fatigue.  HENT: Negative for congestion.   Eyes: Negative for discharge.  Respiratory: Negative for shortness of breath.   Cardiovascular: Negative for chest pain, palpitations and leg swelling.  Gastrointestinal: Negative for nausea and abdominal pain.  Genitourinary: Negative for dysuria.  Musculoskeletal: Negative for falls.  Skin: Negative for rash.  Neurological: Negative for loss of consciousness and headaches.  Endo/Heme/Allergies: Negative for environmental allergies.  Psychiatric/Behavioral: Negative for  depression. The patient is not nervous/anxious.        Objective:    Physical Exam  Constitutional: She is oriented to person, place, and time. She appears well-developed and well-nourished. No distress.  HENT:  Head: Normocephalic and atraumatic.  Nose: Nose normal.  Eyes: Right eye exhibits no discharge. Left eye exhibits no discharge.  Neck: Normal range of motion. Neck supple.  Cardiovascular: Normal rate and regular rhythm.   No murmur heard. Pulmonary/Chest: Effort normal and breath sounds normal.  Abdominal: Soft. Bowel sounds are normal. There is no tenderness.  Musculoskeletal: She exhibits no edema.  Neurological: She is alert and oriented to person, place, and time.  Skin: Skin is warm and dry.  Psychiatric: She has a normal mood and affect.  Nursing note and vitals reviewed.   BP 116/71 mmHg  Pulse 82  Temp(Src) 98.2 F (36.8 C) (Oral)  Ht 5\' 4"  (1.626 m)  Wt 146 lb (66.225 kg)  BMI 25.05 kg/m2  SpO2 100%  LMP 09/08/2011 Wt Readings from Last 3 Encounters:  12/14/14 146 lb (66.225 kg)  12/02/14 145 lb (65.772 kg)  11/11/14 145 lb (65.772 kg)     Lab Results  Component Value Date   WBC 8.8 09/08/2014   HGB 13.9 09/08/2014   HCT 40.8 09/08/2014   PLT 243.0 09/08/2014   GLUCOSE 93 09/08/2014   CHOL 159 09/08/2014   TRIG 132.0 09/08/2014   HDL 65.00 09/08/2014   LDLCALC 68 09/08/2014   ALT 16 09/08/2014   AST 19 09/08/2014   NA 137 09/08/2014   K 3.8 09/08/2014   CL 103 09/08/2014   CREATININE 0.85 09/08/2014   BUN 12 09/08/2014   CO2 29 09/08/2014   TSH 1.07 09/08/2014    Lab Results  Component Value Date   TSH 1.07 09/08/2014   Lab Results  Component Value Date   WBC 8.8 09/08/2014   HGB 13.9 09/08/2014   HCT 40.8 09/08/2014   MCV 90.4 09/08/2014   PLT 243.0 09/08/2014   Lab Results  Component Value Date   NA 137 09/08/2014   K 3.8 09/08/2014   CO2 29 09/08/2014   GLUCOSE 93 09/08/2014   BUN 12 09/08/2014   CREATININE 0.85  09/08/2014   BILITOT 0.4 09/08/2014   ALKPHOS 45 09/08/2014   AST 19 09/08/2014   ALT 16 09/08/2014   PROT 6.9 09/08/2014   ALBUMIN 4.4 09/08/2014   CALCIUM 9.6 09/08/2014   GFR 74.53 09/08/2014   Lab Results  Component Value Date   CHOL 159 09/08/2014   Lab Results  Component Value Date   HDL 65.00 09/08/2014   Lab Results  Component Value Date   LDLCALC 68 09/08/2014   Lab Results  Component Value Date  TRIG 132.0 09/08/2014   Lab Results  Component Value Date   CHOLHDL 2 09/08/2014   No results found for: HGBA1C     Assessment & Plan:   Chronic cough Follows with Dr Harriette Ohara, pulmonology at [facility], was told vocal cord polyps are gone but silent heartburn is the culprit. Started on Pantoprazole bid for now and will titrate down. Is questioning if Pantoprazole is worsening her insomnia. May try one week of stopping Pantoprazole in eve for a week and substituting Zantac as needed. Try Elderberry and ginger to sooth throat  Esophageal reflux Follows with Dr Harriette Ohara, pulmonology at [facility], was told vocal cord polyps are gone but silent heartburn is the culprit. Started on Pantoprazole bid for now and will titrate down. Is questioning if Pantoprazole is worsening her insomnia. May try one week of stopping Pantoprazole in eve for a week and substituting Zantac as needed. Try Elderberry and ginger to sooth throat. Avoid offending foods, start probiotics. Do not eat large meals in late evening and consider raising head of bed.   Multiple thyroid nodules Repeat ultrasound of thyroid at 6 month mark  Internal hemorrhoid Has appt with Dr Romie Levee, surgery to have consultation to have it removed on 9/13. Asymptomatic at this time. Maintain fiber and fluids, start probiotic  Insomnia Encouraged good sleep hygiene such as dark, quiet room. No blue/green glowing lights such as computer screens in bedroom. No alcohol or stimulants in evening. Cut down on caffeine as  able. Regular exercise is helpful but not just prior to bed time. Refill Ambien as needed   I am having Ms. Duchene maintain her ketoconazole, triamcinolone cream, fexofenadine, esomeprazole, albuterol, Hydrocortisone Ace-Pramoxine, zolpidem, and pantoprazole.  No orders of the defined types were placed in this encounter.     Baird Kay, LPN

## 2014-12-14 NOTE — Patient Instructions (Addendum)
Ginger lozenges or elderberry liquids or lozenges. Probiotics found over the counter [employer] or online [employer] at [employer] 64 oz. Of clear fluids/water daily.  Cough, Adult  A cough is a reflex that helps clear your throat and airways. It can help heal the body or may be a reaction to an irritated airway. A cough may only last 2 or 3 weeks (acute) or may last more than 8 weeks (chronic).  CAUSES Acute cough:  Viral or bacterial infections. Chronic cough:  Infections.  Allergies.  Asthma.  Post-nasal drip.  Smoking.  Heartburn or acid reflux.  Some medicines.  Chronic lung problems (COPD).  Cancer. SYMPTOMS   Cough.  Fever.  Chest pain.  Increased breathing rate.  High-pitched whistling sound when breathing (wheezing).  Colored mucus that you cough up (sputum). TREATMENT   A bacterial cough may be treated with antibiotic medicine.  A viral cough must run its course and will not respond to antibiotics.  Your caregiver may recommend other treatments if you have a chronic cough. HOME CARE INSTRUCTIONS   Only take over-the-counter or prescription medicines for pain, discomfort, or fever as directed by your caregiver. Use cough suppressants only as directed by your caregiver.  Use a cold steam vaporizer or humidifier in your bedroom or home to help loosen secretions.  Sleep in a semi-upright position if your cough is worse at night.  Rest as needed.  Stop smoking if you smoke. SEEK IMMEDIATE MEDICAL CARE IF:   You have pus in your sputum.  Your cough starts to worsen.  You cannot control your cough with suppressants and are losing sleep.  You begin coughing up blood.  You have difficulty breathing.  You develop pain which is getting worse or is uncontrolled with medicine.  You have a fever. MAKE SURE YOU:   Understand these instructions.  Will watch your condition.  Will get help right away if  you are not doing well or get worse. Document Released: 09/30/2010 Document Revised: 06/26/2011 Document Reviewed: 09/30/2010 [facility] Patient Information 2015 [place], [place]. This information is not intended to replace advice given to you by your health care provider. Make sure you discuss any questions you have with your health care provider.

## 2015-01-18 ENCOUNTER — Other Ambulatory Visit: Payer: Self-pay | Admitting: General Surgery

## 2015-01-18 ENCOUNTER — Telehealth: Payer: Self-pay | Admitting: General Surgery

## 2015-01-18 NOTE — Telephone Encounter (Signed)
Opened in error

## 2015-01-26 ENCOUNTER — Other Ambulatory Visit: Payer: Self-pay | Admitting: Family Medicine

## 2015-01-26 MED ORDER — ZOLPIDEM TARTRATE 10 MG PO TABS: 10.0000 mg | ORAL_TABLET | Freq: Every evening | ORAL | Status: DC | PRN

## 2015-01-26 NOTE — Telephone Encounter (Signed)
Patient informed faxed hardcopy for Ambien to [facility]

## 2015-01-26 NOTE — Telephone Encounter (Signed)
Caller name: Juliann Olesky   Relationship to patient: Self   Can be [phone]  Pharmacy: [id number] - [place], [place] - [address]  Reason for call: Pt is requesting a refill on her AMBIEN Rx.

## 2015-01-26 NOTE — Telephone Encounter (Signed)
Requesting:  Ambien Contract   None UDS    None Last OV   12/14/14 Last Refill   #15 with 0 refills 11/23/14  Please Advise

## 2015-03-24 ENCOUNTER — Ambulatory Visit (HOSPITAL_BASED_OUTPATIENT_CLINIC_OR_DEPARTMENT_OTHER)
Admission: RE | Admit: 2015-03-24 | Discharge: 2015-03-24 | Disposition: A | Discharge status: Home / Self Care | Payer: Managed Care, Other (non HMO) | Source: Ambulatory Visit | Attending: Family Medicine | Admitting: Family Medicine

## 2015-03-24 DIAGNOSIS — E042 Nontoxic multinodular goiter: Secondary | ICD-10-CM | POA: Insufficient documentation

## 2015-03-25 ENCOUNTER — Telehealth: Payer: Self-pay | Admitting: Family Medicine

## 2015-03-25 NOTE — Telephone Encounter (Signed)
documented

## 2015-03-25 NOTE — Telephone Encounter (Signed)
Called pt, she says that she has had her flu vacc at work in October.

## 2015-03-29 ENCOUNTER — Ambulatory Visit (HOSPITAL_BASED_OUTPATIENT_CLINIC_OR_DEPARTMENT_OTHER): Payer: Managed Care, Other (non HMO)

## 2015-04-01 ENCOUNTER — Ambulatory Visit (HOSPITAL_BASED_OUTPATIENT_CLINIC_OR_DEPARTMENT_OTHER)
Admission: RE | Admit: 2015-04-01 | Discharge: 2015-04-01 | Disposition: A | Discharge status: Home / Self Care | Payer: Managed Care, Other (non HMO) | Source: Ambulatory Visit | Attending: Medical | Admitting: Medical

## 2015-04-01 ENCOUNTER — Ambulatory Visit (INDEPENDENT_AMBULATORY_CARE_PROVIDER_SITE_OTHER): Payer: Managed Care, Other (non HMO) | Admitting: Medical

## 2015-04-01 ENCOUNTER — Encounter: Payer: Self-pay | Admitting: Medical

## 2015-04-01 VITALS — BP 118/70 | HR 88 | Temp 97.5°F | Ht 63.5 in | Wt 147.0 lb

## 2015-04-01 DIAGNOSIS — R319 Hematuria, unspecified: Secondary | ICD-10-CM

## 2015-04-01 DIAGNOSIS — M545 Low back pain, unspecified: Secondary | ICD-10-CM

## 2015-04-01 DIAGNOSIS — R109 Unspecified abdominal pain: Secondary | ICD-10-CM | POA: Diagnosis not present

## 2015-04-01 DIAGNOSIS — R1084 Generalized abdominal pain: Secondary | ICD-10-CM

## 2015-04-01 LAB — CBC WITH DIFFERENTIAL/PLATELET
Basophils Absolute: 0 10*3/uL (ref 0.0–0.1)
Basophils Relative: 0 % (ref 0–1)
Eosinophils Absolute: 0.6 10*3/uL (ref 0.0–0.7)
Eosinophils Relative: 6 % — ABNORMAL HIGH (ref 0–5)
HCT: 42.1 % (ref 36.0–46.0)
Hemoglobin: 14.5 g/dL (ref 12.0–15.0)
Lymphocytes Relative: 26 % (ref 12–46)
Lymphs Abs: 2.4 10*3/uL (ref 0.7–4.0)
MCH: 31.9 pg (ref 26.0–34.0)
MCHC: 34.4 g/dL (ref 30.0–36.0)
MCV: 92.5 fL (ref 78.0–100.0)
MPV: 10 fL (ref 8.6–12.4)
Monocytes Absolute: 0.8 10*3/uL (ref 0.1–1.0)
Monocytes Relative: 9 % (ref 3–12)
Neutro Abs: 5.4 10*3/uL (ref 1.7–7.7)
Neutrophils Relative %: 59 % (ref 43–77)
Platelets: 241 10*3/uL (ref 150–400)
RBC: 4.55 MIL/uL (ref 3.87–5.11)
RDW: 13.1 % (ref 11.5–15.5)
WBC: 9.2 10*3/uL (ref 4.0–10.5)

## 2015-04-01 LAB — LIPASE: Lipase: 24 U/L (ref 7–60)

## 2015-04-01 LAB — POCT URINALYSIS DIPSTICK
Bilirubin, UA: NEGATIVE
Glucose, UA: NEGATIVE
KETONES UA: NEGATIVE
Leukocytes, UA: NEGATIVE
NITRITE UA: NEGATIVE
PROTEIN UA: NEGATIVE
Urobilinogen, UA: 0.2
pH, UA: 6

## 2015-04-01 LAB — COMPREHENSIVE METABOLIC PANEL WITH GFR
ALT: 13 U/L (ref 6–29)
AST: 16 U/L (ref 10–35)
Albumin: 4.2 g/dL (ref 3.6–5.1)
Alkaline Phosphatase: 42 U/L (ref 33–130)
BUN: 18 mg/dL (ref 7–25)
CO2: 29 mmol/L (ref 20–31)
Calcium: 9.3 mg/dL (ref 8.6–10.4)
Chloride: 104 mmol/L (ref 98–110)
Creat: 0.98 mg/dL (ref 0.50–1.05)
Glucose, Bld: 94 mg/dL (ref 65–99)
Potassium: 4 mmol/L (ref 3.5–5.3)
Sodium: 141 mmol/L (ref 135–146)
Total Bilirubin: 0.3 mg/dL (ref 0.2–1.2)
Total Protein: 6.6 g/dL (ref 6.1–8.1)

## 2015-04-01 LAB — AMYLASE: Amylase: 41 U/L (ref 0–105)

## 2015-04-01 MED ORDER — DICLOFENAC SODIUM 75 MG PO TBEC: 75.0000 mg | DELAYED_RELEASE_TABLET | Freq: Two times a day (BID) | ORAL | Status: DC

## 2015-04-01 MED ORDER — HYDROCODONE-ACETAMINOPHEN 5-325 MG PO TABS: 1.0000 | ORAL_TABLET | Freq: Four times a day (QID) | ORAL | Status: DC | PRN

## 2015-04-01 NOTE — Progress Notes (Signed)
Subjective:    Patient ID: Patricia Roy, female    DOB: 1960-05-19, 54 y.o.   MRN: [id number]  HPI  2 wks back pain. Tried heat, cold, ibuprofen. Pain on rt side and hurts when bending over. No fall or trauma. No hx of chronic back pain. But she does remember one episode of back pain when she was pregnant. But that was 14 yrs ago.  No pain radiating to legs. No saddle anesthesia. No incontinence.   Last night felt some mild pain on side of her stomach/abdomen . No fever, no nausea or vomiting. She felt some chills. This pain is still present today. She describes that it is present with back pain She denies any uti type symptoms. She has normal appetite. No nausea or vomiting. Pain iin back and abdomen is constant pain. Present without movement. Though as states above she has increase of rt side back pain with movement.     Review of Systems  Constitutional: Negative for fever, chills and fatigue.  Respiratory: Negative for cough, chest tightness and wheezing.   Cardiovascular: Negative for chest pain and palpitations.  Gastrointestinal: Positive for abdominal pain. Negative for nausea and vomiting.       Mild abdomen pain. Seems to radiate some from the back.  Genitourinary: Positive for flank pain. Negative for dysuria, urgency, hematuria and vaginal pain.  Musculoskeletal: Positive for back pain.  Neurological: Negative for weakness and numbness.  Psychiatric/Behavioral: Negative for behavioral problems and confusion.     Past Medical History  Diagnosis Date  . Blood stool     Hemorrhoids  . Urine incontinence   . Chronic cough 09/08/2014  . Rectal bleeding 09/08/2014  . 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 Hx: UTI (urinary tract infection) 09/08/2014  . Insomnia 09/19/2014  . Esophageal reflux 09/19/2014  . Multiple thyroid nodules 09/19/2014    and vocal cord nodules  . Internal hemorrhoid 09/08/2014    Social History   Social History  . Marital Status: Married   Spouse Name: N/A  . Number of Children: 3  . Years of Education: 16   Occupational History  . Utilization Review for [employer]    Social History Main Topics  . Smoking status: Current Some Day Smoker -- 0.50 packs/day    Types: Cigarettes  . Smokeless tobacco: Never Used  . Alcohol Use: 0.0 oz/week    0 Standard drinks or equivalent per week     Comment: Occasional  . Drug Use: No  . Sexual Activity: Yes     Comment: lives with husband (heart diseaseand duaghters, no dietary restrictions, works ortho [employer],...INTERCOURSE AGE 30, SEXUALPARTNERS LESS THAN 5   Other Topics Concern  . Not on file   Social History Narrative    Past Surgical History  Procedure Laterality Date  . Tonsillectomy    . Child birth  9, 2000 and 2004    stitches each time    Family History  Problem Relation Age of Onset  . Heart disease Mother     H/O valve replacement at 25  . Stroke Mother   . Pulmonary embolism Mother   . Pancreatic cancer Father   . Alcohol abuse Father   . Alzheimer's disease Maternal Aunt   . Other Sister     chronic lyme  . Pulmonary embolism Brother     DVTs  . Alcohol abuse Brother     recovering  . Migraines Daughter   . Alcohol abuse Brother  recovering  . Alcohol abuse Sister     dementia  . Dementia Sister     Allergies  Allergen Reactions  . Paxil [Paroxetine]   . Sulfonamide Derivatives     Current Outpatient Prescriptions on File Prior to Visit  Medication Sig Dispense Refill  . albuterol (PROVENTIL HFA;VENTOLIN HFA) 108 (90 BASE) MCG/ACT inhaler Inhale 2 puffs into the lungs every 6 (six) hours as needed for wheezing or shortness of breath. 1 Inhaler 3  . fexofenadine (ALLEGRA) 30 MG tablet Take 30 mg by mouth as needed.    Marland Kitchen. ketoconazole (NIZORAL) 2 % cream   3  . pantoprazole (PROTONIX) 40 MG tablet Take 40 mg by mouth 2 (two) times daily before a meal.     . Pramoxine-HC (HYDROCORTISONE ACE-PRAMOXINE) 2.5-1 % CREA Apply 1 application  topically 3 (three) times daily. 1 Tube 1  . triamcinolone cream (KENALOG) 0.1 %   3  . zolpidem (AMBIEN) 10 MG tablet Take 1 tablet (10 mg total) by mouth at bedtime as needed for sleep. 15 tablet 0   No current facility-administered medications on file prior to visit.    BP 118/70 mmHg  Pulse 88  Temp(Src) 97.5 F (36.4 C) (Oral)  Ht 5' 3.5" (1.613 m)  Wt 147 lb (66.679 kg)  BMI 25.63 kg/m2  SpO2 98%  LMP 09/08/2011       Objective:   Physical Exam  General Appearance- Not in acute distress.    Chest and Lung Exam Auscultation: Breath sounds:-Normal. Clear even and unlabored. Adventitious sounds:- No Adventitious sounds.  Cardiovascular Auscultation:Rythm - Regular, rate and rythm. Heart Sounds -Normal heart sounds.  Abdomen Inspection:-Inspection Normal.  Palpation/Perucssion: Palpation and Percussion of the abdomen reveal- Non Tender excpet some pain rt side abdomen level of umblicus but not in rlq region, No Rebound tenderness, No rigidity(Guarding) and No Palpable abdominal masses.  Liver:-Normal.  Spleen:- Normal.   Back Mild  Rt side para lumbar spine tenderness to palpation. Mid parspinal area on rt side circlar small lipoma like area on palpation.(but also some rt  direct cva area tenderness) Pain on straight leg lift. Pain on lateral movements and flexion/extension of the spine.  Lower ext neurologic  L5-S1 sensation intact bilaterally. No foot drop bilaterally.      Assessment & Plan:  For your back pain over kidney, blood in urine and abdomen will get labs and renal stone protocol.  Will rx diclofenac.  For severe pain take norco.  Advised hydrate well.  Follow up in 5 days or as needed  Did discuss with pt study results. She is concerned for possible uti. I will rx macrobid 3 days in event uti symptoms occur pending culture. I was going to rx cipro. She states prefers macrobid.   I asked her to notify us if signs or symptoms change or  worsen.  [phone]

## 2015-04-01 NOTE — Progress Notes (Signed)
Pre visit review using our clinic review tool, if applicable. No additional management support is needed unless otherwise documented below in the visit note. 

## 2015-04-01 NOTE — Patient Instructions (Addendum)
For your back pain over kidney, blood in urine and abdomen will get labs and renal stone protocol.  Will rx diclofenac for moderate pain.  For severe pain take norco.  Advised hydrate well  Follow up in 5 days or as needed

## 2015-04-02 ENCOUNTER — Telehealth: Payer: Self-pay | Admitting: Family Medicine

## 2015-04-02 MED ORDER — NITROFURANTOIN MONOHYD MACRO 100 MG PO CAPS: 100.0000 mg | ORAL_CAPSULE | Freq: Two times a day (BID) | ORAL | Status: DC

## 2015-04-02 NOTE — Telephone Encounter (Signed)
Relation to ZO:XWRUpt:self Call back number:[phone] Pharmacy: [id number] - [place], [place] - [address] [phone] (Phone) [phone])         Reason for call:  Patient requesting macrobid due to the symtopms patient was experiencing at the time of visit 04/01/15

## 2015-04-02 NOTE — Telephone Encounter (Signed)
I thought I rx'd macrobid  med yesterday. Got message and looked like I did not so i sent it in today. Called pt to notify med sent in.

## 2015-04-03 LAB — URINE CULTURE

## 2015-05-03 ENCOUNTER — Telehealth: Payer: Self-pay | Admitting: Family Medicine

## 2015-05-03 NOTE — Telephone Encounter (Signed)
Can be reached: [phone] [id number] [place], [place] - [address]  Reason for call: Pt requesting refill on ambien. She is out. Pt would also like RX for Wellbutrin to help quit smoking. She is in a class and they indicate it is helpful when quitting.

## 2015-05-03 NOTE — Telephone Encounter (Signed)
Requesting:  zolpidem Contract   none UDS   None Last OV  12/14/2014 Last Refill   #15 0 refills on 01/26/2015  Please Advise

## 2015-05-03 NOTE — Telephone Encounter (Signed)
She needs an appt before June if we are going to start a med and she wants to be able to call in for a refill on the Ambien. I need to see people every 6 months to keep Ambien UTD and to add Wellbutrin. We can start Wellbutrin XL 150 mg tabs 1 tab po daily x 4 days disp #4 then increase to Wellbutrin XL 300 mg tabs 1 tab po daily disp #30 with 1 rf. Then she should come in in roughly 6 weeks to see how she is doing. Any concerns or GI up set with new med let us know

## 2015-05-04 MED ORDER — BUPROPION HCL ER (XL) 150 MG PO TB24: 150.0000 mg | ORAL_TABLET | Freq: Every day | ORAL | Status: DC

## 2015-05-04 MED ORDER — BUPROPION HCL ER (XL) 300 MG PO TB24: 300.0000 mg | ORAL_TABLET | Freq: Every day | ORAL | Status: DC

## 2015-05-04 MED ORDER — ZOLPIDEM TARTRATE 10 MG PO TABS: 10.0000 mg | ORAL_TABLET | Freq: Every evening | ORAL | Status: DC | PRN

## 2015-05-04 NOTE — Telephone Encounter (Signed)
Sent in both Wellbutrin strengths to local pharmacy printed zolpidem and faxed as well. Called the patient informed of all PCP instructions regarding appointment/refills.  The patient did voice understanding and did schedule followup appointment to see how she is doing on new medication.

## 2015-05-13 ENCOUNTER — Telehealth: Payer: Self-pay | Admitting: Family Medicine

## 2015-05-13 DIAGNOSIS — R300 Dysuria: Secondary | ICD-10-CM

## 2015-05-13 NOTE — Telephone Encounter (Signed)
Relation to ZO:XWRU Call back number: [phone]  Pharmacy: [id number] - [place], [place] - [address] [phone] (Phone) [phone] (Fax)         Reason for call:  Patient was last seen 04/01/2015 for UTI and patient states symptoms are back requesting macrobid

## 2015-05-13 NOTE — Telephone Encounter (Signed)
Left message for pt to call back and schedule an appointment to recheck the urine.

## 2015-05-14 ENCOUNTER — Encounter: Payer: Self-pay | Admitting: Family Medicine

## 2015-05-14 ENCOUNTER — Telehealth: Payer: Self-pay | Admitting: Family Medicine

## 2015-05-14 ENCOUNTER — Other Ambulatory Visit (INDEPENDENT_AMBULATORY_CARE_PROVIDER_SITE_OTHER): Payer: Managed Care, Other (non HMO)

## 2015-05-14 ENCOUNTER — Other Ambulatory Visit: Payer: Self-pay | Admitting: *Deleted

## 2015-05-14 DIAGNOSIS — R300 Dysuria: Secondary | ICD-10-CM | POA: Diagnosis not present

## 2015-05-14 LAB — URINALYSIS, ROUTINE W REFLEX MICROSCOPIC
NITRITE: POSITIVE — AB
Specific Gravity, Urine: 1.02 (ref 1.000–1.030)
URINE GLUCOSE: 250 — AB
pH: 5 (ref 5.0–8.0)

## 2015-05-14 MED ORDER — PHENAZOPYRIDINE HCL 200 MG PO TABS: 200.0000 mg | ORAL_TABLET | Freq: Three times a day (TID) | ORAL | Status: DC | PRN

## 2015-05-14 MED ORDER — NITROFURANTOIN MONOHYD MACRO 100 MG PO CAPS: 100.0000 mg | ORAL_CAPSULE | Freq: Two times a day (BID) | ORAL | Status: DC

## 2015-05-14 NOTE — Telephone Encounter (Signed)
Called the patient left a detailed message prescriptions have been sent in to her pharmacy.

## 2015-05-14 NOTE — Telephone Encounter (Signed)
Called the patient informed of results/instructions. 

## 2015-05-14 NOTE — Telephone Encounter (Signed)
Pt requesting refill on prescription-- phenazopyridine 200 mg . Please advise.

## 2015-05-14 NOTE — Telephone Encounter (Signed)
Relation to ZH:YQMV Call back number:[phone] Pharmacy: [id number] - [place], [place] - [address] [phone] (Phone) [phone] (Fax)         Reason for call:  Patient states PA stated he would send in antibiotics for her UTI, patient states she is in pain. Please advise

## 2015-05-14 NOTE — Telephone Encounter (Signed)
Patient is in the office now and wants labs ordered.  Should we schedule appt or just lab appt. ??  Advise

## 2015-05-14 NOTE — Telephone Encounter (Signed)
Spoke to [employer] and he did ok putting order in for ua and urine culture.   Order is done and patient aware.

## 2015-05-14 NOTE — Telephone Encounter (Signed)
Please review this is not out patient.

## 2015-05-14 NOTE — Telephone Encounter (Signed)
For uti symptoms. Will rx macrobid and pyridium pending culture.

## 2015-05-14 NOTE — Telephone Encounter (Deleted)
.  sw

## 2015-05-14 NOTE — Telephone Encounter (Signed)
error:[id number] ° °

## 2015-05-15 LAB — URINE CULTURE

## 2015-05-24 ENCOUNTER — Telehealth: Payer: Self-pay | Admitting: Family Medicine

## 2015-05-24 DIAGNOSIS — R8290 Unspecified abnormal findings in urine: Secondary | ICD-10-CM

## 2015-05-24 DIAGNOSIS — N390 Urinary tract infection, site not specified: Secondary | ICD-10-CM

## 2015-05-24 NOTE — Telephone Encounter (Signed)
Called the patient and she stated she is still having dark colored urine with odor. No other symptoms.  She would like a refill on macrobid and states also she did not hear back from her culture results (She was seen by Ramon Dredge and it does look like she was informed??).

## 2015-05-24 NOTE — Telephone Encounter (Signed)
Lab appt. Made and order entered.  She will wait until results come in and not get antibiotic refilled at this time.

## 2015-05-24 NOTE — Telephone Encounter (Signed)
Looks like the urine was contaminated so no one organism grew out. It could be that this infection is resistant to macrobid I am willing to retreat once with Macrobid with same sig, but her best bet is to come in and give another urine sample again so I can culture it and see if anything grows. That way if she does not get better maybe this time I can identify why

## 2015-05-24 NOTE — Telephone Encounter (Signed)
Pt asking for refill on macrobid. She is requesting call back from Robin rather than the nurse from last week. She states that she has cont symptoms of UTI and states she did not receive results of urine culture. She said there is supposed to be a standing order in the system when she feels like she is getting a UTI she can come in for UA/culture. Please call at [phone].

## 2015-05-25 ENCOUNTER — Other Ambulatory Visit (INDEPENDENT_AMBULATORY_CARE_PROVIDER_SITE_OTHER): Payer: Managed Care, Other (non HMO)

## 2015-05-25 DIAGNOSIS — N390 Urinary tract infection, site not specified: Secondary | ICD-10-CM | POA: Diagnosis not present

## 2015-05-25 DIAGNOSIS — R8290 Unspecified abnormal findings in urine: Secondary | ICD-10-CM

## 2015-05-25 LAB — URINALYSIS
BILIRUBIN URINE: NEGATIVE
Ketones, ur: NEGATIVE
Leukocytes, UA: NEGATIVE
NITRITE: NEGATIVE
PH: 7 (ref 5.0–8.0)
Specific Gravity, Urine: 1.02 (ref 1.000–1.030)
TOTAL PROTEIN, URINE-UPE24: NEGATIVE
Urine Glucose: NEGATIVE
Urobilinogen, UA: 0.2 (ref 0.0–1.0)

## 2015-05-27 ENCOUNTER — Other Ambulatory Visit: Payer: Self-pay | Admitting: Family Medicine

## 2015-05-27 ENCOUNTER — Telehealth: Payer: Self-pay | Admitting: Family Medicine

## 2015-05-27 MED ORDER — NITROFURANTOIN MONOHYD MACRO 100 MG PO CAPS: 100.0000 mg | ORAL_CAPSULE | Freq: Two times a day (BID) | ORAL | Status: DC

## 2015-05-27 NOTE — Telephone Encounter (Signed)
[facility] received an order for a urine culture.  They have no urine

## 2015-05-27 NOTE — Telephone Encounter (Signed)
Our lab employee called the [facility] and they did not have her urine, evidently threw it out the next day. Lab at our office has called the patient to come back in to recollect urine.

## 2015-05-27 NOTE — Telephone Encounter (Signed)
I have sent in the Macrobid please let her know

## 2015-05-27 NOTE — Telephone Encounter (Signed)
I have copied message from PCP 05/24/2015 telephone note.Below. Looks like the urine was contaminated so no one organism grew out. It could be that this infection is resistant to macrobid I am willing to retreat once with Macrobid with same sig, but her best bet is to come in and give another urine sample again so I can culture it and see if anything grows. That way if she does not get better maybe this time I can identify why   The patient does not want to come back in to recollect her urine as she works in [place] and is just not convenient.  She would like PCP to send in the macrobid as told in previous note.  She is not having any symptoms.  She will see PCP on 06/17/2015 and can check at that time.  She has by the way stopped wellbutrin due to side effects, BUT has happily stopped smoking.

## 2015-05-28 NOTE — Telephone Encounter (Signed)
Patient informed. 

## 2015-06-14 ENCOUNTER — Encounter: Payer: Self-pay | Admitting: Family

## 2015-06-14 ENCOUNTER — Encounter: Payer: Self-pay | Admitting: Family Medicine

## 2015-06-14 ENCOUNTER — Ambulatory Visit (INDEPENDENT_AMBULATORY_CARE_PROVIDER_SITE_OTHER): Payer: Managed Care, Other (non HMO) | Admitting: Family

## 2015-06-14 VITALS — BP 122/89 | HR 86 | Temp 97.5°F | Resp 18 | Ht 63.5 in | Wt 146.8 lb

## 2015-06-14 DIAGNOSIS — N3001 Acute cystitis with hematuria: Secondary | ICD-10-CM

## 2015-06-14 DIAGNOSIS — M545 Low back pain, unspecified: Secondary | ICD-10-CM

## 2015-06-14 LAB — POCT URINALYSIS DIPSTICK
Bilirubin, UA: NEGATIVE
Glucose, UA: NEGATIVE
KETONES UA: NEGATIVE
LEUKOCYTES UA: NEGATIVE
NITRITE UA: NEGATIVE
PH UA: 6
PROTEIN UA: NEGATIVE
Spec Grav, UA: 1.025
Urobilinogen, UA: NEGATIVE

## 2015-06-14 MED ORDER — CIPROFLOXACIN HCL 500 MG PO TABS: 500.0000 mg | ORAL_TABLET | Freq: Two times a day (BID) | ORAL | Status: DC

## 2015-06-14 NOTE — Progress Notes (Signed)
Pre visit review using our clinic review tool, if applicable. No additional management support is needed unless otherwise documented below in the visit note. 

## 2015-06-14 NOTE — Progress Notes (Signed)
Subjective:    Patient ID: Patricia Roy, female    DOB: October 19, 1960, 55 y.o.   MRN: [id number]  HPI  [name] is a 55 yr old female who presents today with chief complaint of right sided low back pain and foul smelling urine.  She saw Kristine Garbe PA-C on 1/27 and had a urine culture performed which showed 100K of bacteria- multiple morphotypes. She was treated with macrodantin x 2.  Reports no improvement in her low back pain despite use of voltaren, ibuprophen and hydrocodone.   Review of Systems    see HPI  Past Medical History  Diagnosis Date  . Blood stool     Hemorrhoids  . Urine incontinence   . Chronic cough 09/08/2014  . Rectal bleeding 09/08/2014  . 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 Hx: UTI (urinary tract infection) 09/08/2014  . Insomnia 09/19/2014  . Esophageal reflux 09/19/2014  . Multiple thyroid nodules 09/19/2014    and vocal cord nodules  . Internal hemorrhoid 09/08/2014    Social History   Social History  . Marital Status: Married    Spouse Name: N/A  . Number of Children: 3  . Years of Education: 16   Occupational History  . Utilization Review for [employer]    Social History Main Topics  . Smoking status: Former Smoker -- 0.50 packs/day    Types: Cigarettes    Quit date: 04/19/2015  . Smokeless tobacco: Never Used  . Alcohol Use: 0.0 oz/week    0 Standard drinks or equivalent per week     Comment: Occasional  . Drug Use: No  . Sexual Activity: Yes     Comment: lives with husband (heart diseaseand duaghters, no dietary restrictions, works ortho [employer],...INTERCOURSE AGE 61, SEXUALPARTNERS LESS THAN 5   Other Topics Concern  . Not on file   Social History Narrative    Past Surgical History  Procedure Laterality Date  . Tonsillectomy    . Child birth  57, 2000 and 2004    stitches each time    Family History  Problem Relation Age of Onset  . Heart disease Mother     H/O valve replacement at 71  . Stroke Mother   . Pulmonary  embolism Mother   . Pancreatic cancer Father   . Alcohol abuse Father   . Alzheimer's disease Maternal Aunt   . Other Sister     chronic lyme  . Pulmonary embolism Brother     DVTs  . Alcohol abuse Brother     recovering  . Migraines Daughter   . Alcohol abuse Brother     recovering  . Alcohol abuse Sister     dementia  . Dementia Sister     Allergies  Allergen Reactions  . Paxil [Paroxetine]   . Sulfonamide Derivatives     Current Outpatient Prescriptions on File Prior to Visit  Medication Sig Dispense Refill  . albuterol (PROVENTIL HFA;VENTOLIN HFA) 108 (90 BASE) MCG/ACT inhaler Inhale 2 puffs into the lungs every 6 (six) hours as needed for wheezing or shortness of breath. 1 Inhaler 3  . buPROPion (WELLBUTRIN XL) 300 MG 24 hr tablet Take 1 tablet (300 mg total) by mouth daily. (Patient taking differently: Take 150 mg by mouth daily. ) 30 tablet 1  . diclofenac (VOLTAREN) 75 MG EC tablet Take 1 tablet (75 mg total) by mouth 2 (two) times daily. 30 tablet 0  . fexofenadine (ALLEGRA) 30 MG tablet Take 30 mg by mouth as  needed.    Marland Kitchen HYDROcodone-acetaminophen (NORCO) 5-325 MG tablet Take 1 tablet by mouth every 6 (six) hours as needed for moderate pain. 30 tablet 0  . ketoconazole (NIZORAL) 2 % cream   3  . pantoprazole (PROTONIX) 40 MG tablet Take 40 mg by mouth 2 (two) times daily before a meal.     . Pramoxine-HC (HYDROCORTISONE ACE-PRAMOXINE) 2.5-1 % CREA Apply 1 application topically 3 (three) times daily. 1 Tube 1  . triamcinolone cream (KENALOG) 0.1 %   3  . zolpidem (AMBIEN) 10 MG tablet Take 1 tablet (10 mg total) by mouth at bedtime as needed for sleep. 15 tablet 0   No current facility-administered medications on file prior to visit.    BP 122/89 mmHg  Pulse 86  Temp(Src) 97.5 F (36.4 C) (Oral)  Resp 18  Ht 5' 3.5" (1.613 m)  Wt 146 lb 12.8 oz (66.588 kg)  BMI 25.59 kg/m2  SpO2 99%  LMP 09/08/2011    Objective:   Physical Exam  Constitutional: She is  oriented to person, place, and time. She appears well-developed and well-nourished.  Cardiovascular: Normal rate, regular rhythm and normal heart sounds.   No murmur heard. Pulmonary/Chest: Effort normal and breath sounds normal. No respiratory distress. She has no wheezes.  Abdominal: Soft. Bowel sounds are normal.  Mild right sided abdominal discomfort without guarding  CVAT right side only  Neurological: She is alert and oriented to person, place, and time.  Skin: Skin is warm and dry.  Psychiatric: She has a normal mood and affect. Her behavior is normal. Judgment and thought content normal.          Assessment & Plan:  UTI- 1+ blood noted in Urine dip.  Urine will be sent for culture.  She had + RBC in her urine back on  05/14/15 as well.   Previous urine was negative for blood about 8 mos ago.  Had neg CT abd/pelvis back in  December when she had UTI sxs and R sided back pain- no kidney stones noted.  Will rx with cipro  PO BID, x 7 days to cover for pyelonephritis and send urine for culture.  Advised pt to follow back up with [place] as outlined in AVS. She verbalizes understanding.

## 2015-06-14 NOTE — Patient Instructions (Signed)
Start cipro. Call if you develop fever >101, if back pain/symptoms worsens or if back pain is not improved in 3 days.

## 2015-06-14 NOTE — Addendum Note (Signed)
Addended by: Mervin Kung A on: 06/14/2015 12:14 PM   Modules accepted: Orders

## 2015-06-15 ENCOUNTER — Ambulatory Visit (HOSPITAL_BASED_OUTPATIENT_CLINIC_OR_DEPARTMENT_OTHER)
Admission: RE | Admit: 2015-06-15 | Discharge: 2015-06-15 | Disposition: A | Discharge status: Home / Self Care | Payer: Managed Care, Other (non HMO) | Source: Ambulatory Visit | Attending: Family | Admitting: Family

## 2015-06-15 ENCOUNTER — Other Ambulatory Visit: Payer: Self-pay | Admitting: Family

## 2015-06-15 ENCOUNTER — Telehealth: Payer: Self-pay | Admitting: Family

## 2015-06-15 DIAGNOSIS — R109 Unspecified abdominal pain: Secondary | ICD-10-CM

## 2015-06-15 LAB — URINE CULTURE
COLONY COUNT: NO GROWTH
ORGANISM ID, BACTERIA: NO GROWTH

## 2015-06-15 NOTE — Telephone Encounter (Signed)
Spoke with referral co-ordinator. Pt will come in at 4pm. She will also obtain approval via [employer]. Pt has already been notified.

## 2015-06-15 NOTE — Telephone Encounter (Signed)
Spoke to patient. She reports that she continues to have foul smelling urine, left flank pain. Advised pt that I would like her to obtain CT to rule out kidney stone. Advised pt culture neg for bacteria.  Could you please make sure that this gets scheduled this afternoon and patient is notified of appointment?

## 2015-06-16 ENCOUNTER — Telehealth: Payer: Self-pay | Admitting: Family

## 2015-06-16 MED ORDER — MELOXICAM 7.5 MG PO TABS: 7.5000 mg | ORAL_TABLET | Freq: Every day | ORAL | Status: DC

## 2015-06-16 NOTE — Telephone Encounter (Signed)
Pending insurance auth/ clinicals faxed/case # [id number]

## 2015-06-16 NOTE — Telephone Encounter (Signed)
Notified pt and she voices understanding. States she already has appt with Dr Abner Greenspan tomorrow that she has had for some time and will keep that as well.

## 2015-06-16 NOTE — Telephone Encounter (Signed)
Please let pt know that I reviewed her CT scan. Kidneys look normal.  No stones.  Urine culture negative for bacteria.  She should stop cipro.  I suspect that her pain is musculoskeletal in nature. I have sent rx for meloxicam to her pharmacy.  (don't take with any other NSAIDS- I saw old rx for voltaren which I took off list). If pain is not improved in 1 week or of pain worsens, she should be re-evaluated in the office.

## 2015-06-17 ENCOUNTER — Ambulatory Visit (INDEPENDENT_AMBULATORY_CARE_PROVIDER_SITE_OTHER): Payer: Managed Care, Other (non HMO) | Admitting: Family Medicine

## 2015-06-17 ENCOUNTER — Encounter: Payer: Self-pay | Admitting: Family Medicine

## 2015-06-17 VITALS — BP 130/77 | HR 78 | Temp 98.5°F | Ht 64.0 in | Wt 146.4 lb

## 2015-06-17 DIAGNOSIS — K219 Gastro-esophageal reflux disease without esophagitis: Secondary | ICD-10-CM | POA: Diagnosis not present

## 2015-06-17 DIAGNOSIS — K5900 Constipation, unspecified: Secondary | ICD-10-CM | POA: Diagnosis not present

## 2015-06-17 DIAGNOSIS — M545 Low back pain: Secondary | ICD-10-CM

## 2015-06-17 DIAGNOSIS — G4700 Insomnia, unspecified: Secondary | ICD-10-CM | POA: Diagnosis not present

## 2015-06-17 MED ORDER — BUPROPION HCL ER (XL) 150 MG PO TB24: 150.0000 mg | ORAL_TABLET | Freq: Every day | ORAL | Status: DC

## 2015-06-17 MED ORDER — ZOLPIDEM TARTRATE 10 MG PO TABS: 10.0000 mg | ORAL_TABLET | Freq: Every evening | ORAL | Status: DC | PRN

## 2015-06-17 MED ORDER — METHOCARBAMOL 500 MG PO TABS: 500.0000 mg | ORAL_TABLET | Freq: Three times a day (TID) | ORAL | Status: DC | PRN

## 2015-06-17 NOTE — Progress Notes (Signed)
Pre visit review using our clinic review tool, if applicable. No additional management support is needed unless otherwise documented below in the visit note. 

## 2015-06-17 NOTE — Patient Instructions (Addendum)
Encouraged increased hydration and fiber in diet. Daily probiotics. If bowels not moving can use MOM 2 tbls po in 4 oz of warm prune juice by mouth every 2-3 days. If no results then repeat in 4 hours with  Dulcolax suppository pr, may repeat again in 4 more hours as needed. Seek care if symptoms worsen. Consider daily Miralax and/or Dulcolax if symptoms persist. 64 oz of clear fluids daily  Aspercreme and Salon Pas patch with Lidocaine for back pain  Back Pain, Adult Back pain is very common in adults.The cause of back pain is rarely dangerous and the pain often gets better over time.The cause of your back pain may not be known. Some common causes of back pain include:  Strain of the muscles or ligaments supporting the spine.  Wear and tear (degeneration) of the spinal disks.  Arthritis.  Direct injury to the back. For many people, back pain may return. Since back pain is rarely dangerous, most people can learn to manage this condition on their own. HOME CARE INSTRUCTIONS Watch your back pain for any changes. The following actions may help to lessen any discomfort you are feeling:  Remain active. It is stressful on your back to sit or stand in one place for long periods of time. Do not sit, drive, or stand in one place for more than 30 minutes at a time. Take short walks on even surfaces as soon as you are able.Try to increase the length of time you walk each day.  Exercise regularly as directed by your health care provider. Exercise helps your back heal faster. It also helps avoid future injury by keeping your muscles strong and flexible.  Do not stay in bed.Resting more than 1-2 days can delay your recovery.  Pay attention to your body when you bend and lift. The most comfortable positions are those that put less stress on your recovering back. Always use proper lifting techniques, including:  Bending your knees.  Keeping the load close to your body.  Avoiding twisting.  Find a  comfortable position to sleep. Use a firm mattress and lie on your side with your knees slightly bent. If you lie on your back, put a pillow under your knees.  Avoid feeling anxious or stressed.Stress increases muscle tension and can worsen back pain.It is important to recognize when you are anxious or stressed and learn ways to manage it, such as with exercise.  Take medicines only as directed by your health care provider. Over-the-counter medicines to reduce pain and inflammation are often the most helpful.Your health care provider may prescribe muscle relaxant drugs.These medicines help dull your pain so you can more quickly return to your normal activities and healthy exercise.  Apply ice to the injured area:  Put ice in a plastic bag.  Place a towel between your skin and the bag.  Leave the ice on for 20 minutes, 2-3 times a day for the first 2-3 days. After that, ice and heat may be alternated to reduce pain and spasms.  Maintain a healthy weight. Excess weight puts extra stress on your back and makes it difficult to maintain good posture. SEEK MEDICAL CARE IF:  You have pain that is not relieved with rest or medicine.  You have increasing pain going down into the legs or buttocks.  You have pain that does not improve in one week.  You have night pain.  You lose weight.  You have a fever or chills. SEEK IMMEDIATE MEDICAL CARE IF:  You develop new bowel or bladder control problems.  You have unusual weakness or numbness in your arms or legs.  You develop nausea or vomiting.  You develop abdominal pain.  You feel faint.   This information is not intended to replace advice given to you by your health care provider. Make sure you discuss any questions you have with your health care provider.   Document Released: 04/03/2005 Document Revised: 04/24/2014 Document Reviewed: 08/05/2013 Elsevier Interactive Patient Education 2016 [employer].   Constipation,  Adult Constipation is when a person has fewer than three bowel movements a week, has difficulty having a bowel movement, or has stools that are dry, hard, or larger than normal. As people grow older, constipation is more common. A low-fiber diet, not taking in enough fluids, and taking certain medicines may make constipation worse.  CAUSES   Certain medicines, such as antidepressants, pain medicine, iron supplements, antacids, and water pills.   Certain diseases, such as diabetes, irritable bowel syndrome (IBS), thyroid disease, or depression.   Not drinking enough water.   Not eating enough fiber-rich foods.   Stress or travel.   Lack of physical activity or exercise.   Ignoring the urge to have a bowel movement.   Using laxatives too much.  SIGNS AND SYMPTOMS   Having fewer than three bowel movements a week.   Straining to have a bowel movement.   Having stools that are hard, dry, or larger than normal.   Feeling full or bloated.   Pain in the lower abdomen.   Not feeling relief after having a bowel movement.  DIAGNOSIS  Your health care provider will take a medical history and perform a physical exam. Further testing may be done for severe constipation. Some tests may include:  A barium enema X-ray to examine your rectum, colon, and, sometimes, your small intestine.   A sigmoidoscopy to examine your lower colon.   A colonoscopy to examine your entire colon. TREATMENT  Treatment will depend on the severity of your constipation and what is causing it. Some dietary treatments include drinking more fluids and eating more fiber-rich foods. Lifestyle treatments may include regular exercise. If these diet and lifestyle recommendations do not help, your health care provider may recommend taking over-the-counter laxative medicines to help you have bowel movements. Prescription medicines may be prescribed if over-the-counter medicines do not work.  HOME CARE  INSTRUCTIONS   Eat foods that have a lot of fiber, such as fruits, vegetables, whole grains, and beans.  Limit foods high in fat and processed sugars, such as french fries, hamburgers, cookies, candies, and soda.   A fiber supplement may be added to your diet if you cannot get enough fiber from foods.   Drink enough fluids to keep your urine clear or pale yellow.   Exercise regularly or as directed by your health care provider.   Go to the restroom when you have the urge to go. Do not hold it.   Only take over-the-counter or prescription medicines as directed by your health care provider. Do not take other medicines for constipation without talking to your health care provider first.  SEEK IMMEDIATE MEDICAL CARE IF:   You have bright red blood in your stool.   Your constipation lasts for more than 4 days or gets worse.   You have abdominal or rectal pain.   You have thin, pencil-like stools.   You have unexplained weight loss. MAKE SURE YOU:   Understand these instructions.  Will watch  your condition.  Will get help right away if you are not doing well or get worse.   This information is not intended to replace advice given to you by your health care provider. Make sure you discuss any questions you have with your health care provider.   Document Released: 12/31/2003 Document Revised: 04/24/2014 Document Reviewed: 01/13/2013 Elsevier Interactive Patient Education [employer].

## 2015-06-27 ENCOUNTER — Encounter: Payer: Self-pay | Admitting: Family Medicine

## 2015-06-27 DIAGNOSIS — K5900 Constipation, unspecified: Secondary | ICD-10-CM

## 2015-06-27 DIAGNOSIS — M549 Dorsalgia, unspecified: Secondary | ICD-10-CM | POA: Insufficient documentation

## 2015-06-27 HISTORY — DX: Constipation, unspecified: K59.00

## 2015-06-27 HISTORY — DX: Dorsalgia, unspecified: M54.9

## 2015-06-27 NOTE — Assessment & Plan Note (Signed)
Decrease Wellbutrin from 300 to 150 mg

## 2015-06-27 NOTE — Assessment & Plan Note (Signed)
Encouraged moist heat and gentle stretching as tolerated. May try NSAIDs and prescription meds as directed and report if symptoms worsen or seek immediate care. Try topical treatments 

## 2015-06-27 NOTE — Assessment & Plan Note (Signed)
Avoid offending foods, start probiotics. Do not eat large meals in late evening and consider raising head of bed.  

## 2015-06-27 NOTE — Assessment & Plan Note (Signed)
Encouraged good sleep hygiene such as dark, quiet room. No blue/green glowing lights such as computer screens in bedroom. No alcohol or stimulants in evening. Cut down on caffeine as able. Regular exercise is helpful but not just prior to bed time. Refill given on Ambien 

## 2015-06-27 NOTE — Assessment & Plan Note (Signed)
Encouraged increased hydration and fiber in diet. Daily probiotics. If bowels not moving can use MOM 2 tbls po in 4 oz of warm prune juice by mouth every 2-3 days. If no results then repeat in 4 hours with  Dulcolax suppository pr, may repeat again in 4 more hours as needed. Seek care if symptoms worsen. Consider daily Miralax and/or Dulcolax if symptoms persist.  

## 2015-06-27 NOTE — Progress Notes (Signed)
Subjective:    Patient ID: Patricia Roy, female    DOB: 1961/01/13, 55 y.o.   MRN: [id number]  Chief Complaint  Patient presents with  . Follow-up    HPI Patient is in today for follow up. Is doing well. Is interested in titrating down wellbutrin. Is not experiencing any worsening depression or anxiety is noting constipaiton and some back, flank and abdominal pain as a result. No bloody or tarry stool. No fevers, chills or anorexia. Denies CP/palp/SOB/HA/congestion/fevers or GU c/o. Taking meds as prescribed  Past Medical History  Diagnosis Date  . Blood stool     Hemorrhoids  . Urine incontinence   . Chronic cough 09/08/2014  . Rectal bleeding 09/08/2014  . 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 Hx: UTI (urinary tract infection) 09/08/2014  . Insomnia 09/19/2014  . Esophageal reflux 09/19/2014  . Multiple thyroid nodules 09/19/2014    and vocal cord nodules  . Internal hemorrhoid 09/08/2014  . Back pain 06/27/2015  . Constipation 06/27/2015    Past Surgical History  Procedure Laterality Date  . Tonsillectomy    . Child birth  701992, 2000 and 2004    stitches each time    Family History  Problem Relation Age of Onset  . Heart disease Mother     H/O valve replacement at 5286  . Stroke Mother   . Pulmonary embolism Mother   . Pancreatic cancer Father   . Alcohol abuse Father   . Alzheimer's disease Maternal Aunt   . Other Sister     chronic lyme  . Pulmonary embolism Brother     DVTs  . Alcohol abuse Brother     recovering  . Migraines Daughter   . Alcohol abuse Brother     recovering  . Alcohol abuse Sister     dementia  . Dementia Sister     Social History   Social History  . Marital Status: Married    Spouse Name: N/A  . Number of Children: 3  . Years of Education: 16   Occupational History  . Utilization Review for [employer]    Social History Main Topics  . Smoking status: Former Smoker -- 0.50 packs/day    Types: Cigarettes    Quit date: 04/19/2015  .  Smokeless tobacco: Never Used  . Alcohol Use: 0.0 oz/week    0 Standard drinks or equivalent per week     Comment: Occasional  . Drug Use: No  . Sexual Activity: Yes     Comment: lives with husband (heart diseaseand duaghters, no dietary restrictions, works ortho [employer],...INTERCOURSE AGE 3, SEXUALPARTNERS LESS THAN 5   Other Topics Concern  . Not on file   Social History Narrative    Outpatient Prescriptions Prior to Visit  Medication Sig Dispense Refill  . albuterol (PROVENTIL HFA;VENTOLIN HFA) 108 (90 BASE) MCG/ACT inhaler Inhale 2 puffs into the lungs every 6 (six) hours as needed for wheezing or shortness of breath. 1 Inhaler 3  . fexofenadine (ALLEGRA) 30 MG tablet Take 30 mg by mouth as needed.    . meloxicam (MOBIC) 7.5 MG tablet Take 1 tablet (7.5 mg total) by mouth daily. 20 tablet 0  . pantoprazole (PROTONIX) 40 MG tablet Take 40 mg by mouth 2 (two) times daily before a meal.     . buPROPion (WELLBUTRIN XL) 300 MG 24 hr tablet Take 1 tablet (300 mg total) by mouth daily. (Patient taking differently: Take 150 mg by mouth daily. ) 30 tablet 1  .  zolpidem (AMBIEN) 10 MG tablet Take 1 tablet (10 mg total) by mouth at bedtime as needed for sleep. 15 tablet 0  . ketoconazole (NIZORAL) 2 % cream Reported on 06/17/2015  3  . Pramoxine-HC (HYDROCORTISONE ACE-PRAMOXINE) 2.5-1 % CREA Apply 1 application topically 3 (three) times daily. (Patient not taking: Reported on 06/17/2015) 1 Tube 1  . triamcinolone cream (KENALOG) 0.1 % Reported on 06/17/2015  3  . ciprofloxacin (CIPRO) 500 MG tablet Take 1 tablet (500 mg total) by mouth 2 (two) times daily. 14 tablet 0  . HYDROcodone-acetaminophen (NORCO) 5-325 MG tablet Take 1 tablet by mouth every 6 (six) hours as needed for moderate pain. 30 tablet 0   No facility-administered medications prior to visit.    Allergies  Allergen Reactions  . Paxil [Paroxetine]   . Sulfonamide Derivatives     Review of Systems  Constitutional:  Negative for fever and malaise/fatigue.  HENT: Negative for congestion.   Eyes: Negative for blurred vision.  Respiratory: Negative for shortness of breath.   Cardiovascular: Negative for chest pain, palpitations and leg swelling.  Gastrointestinal: Positive for heartburn, abdominal pain and constipation. Negative for nausea, blood in stool and melena.  Genitourinary: Positive for flank pain. Negative for dysuria and frequency.  Musculoskeletal: Positive for back pain. Negative for falls.  Skin: Negative for rash.  Neurological: Negative for dizziness, loss of consciousness and headaches.  Endo/Heme/Allergies: Negative for environmental allergies.  Psychiatric/Behavioral: Negative for depression. The patient is not nervous/anxious.        Objective:    Physical Exam  Constitutional: She is oriented to person, place, and time. She appears well-developed and well-nourished. No distress.  HENT:  Head: Normocephalic and atraumatic.  Nose: Nose normal.  Eyes: Right eye exhibits no discharge. Left eye exhibits no discharge.  Neck: Normal range of motion. Neck supple.  Cardiovascular: Normal rate and regular rhythm.   No murmur heard. Pulmonary/Chest: Effort normal and breath sounds normal.  Abdominal: Soft. Bowel sounds are normal. There is no tenderness.  Musculoskeletal: She exhibits no edema.  Neurological: She is alert and oriented to person, place, and time.  Skin: Skin is warm and dry.  Psychiatric: She has a normal mood and affect.  Nursing note and vitals reviewed.   BP 130/77 mmHg  Pulse 78  Temp(Src) 98.5 F (36.9 C) (Oral)  Ht  (1.626 m)  Wt 146 lb 6 oz (66.395 kg)  BMI 25.11 kg/m2  SpO2 100%  LMP 09/08/2011 Wt Readings from Last 3 Encounters:  06/17/15 146 lb 6 oz (66.395 kg)  06/14/15 146 lb 12.8 oz (66.588 kg)  04/01/15 147 lb (66.679 kg)     Lab Results  Component Value Date   WBC 9.2 04/01/2015   HGB 14.5 04/01/2015   HCT 42.1 04/01/2015   PLT  241 04/01/2015   GLUCOSE 94 04/01/2015   CHOL 159 09/08/2014   TRIG 132.0 09/08/2014   HDL 65.00 09/08/2014   LDLCALC 68 09/08/2014   ALT 13 04/01/2015   AST 16 04/01/2015   NA 141 04/01/2015   K 4.0 04/01/2015   CL 104 04/01/2015   CREATININE 0.98 04/01/2015   BUN 18 04/01/2015   CO2 29 04/01/2015   TSH 1.07 09/08/2014    Lab Results  Component Value Date   TSH 1.07 09/08/2014   Lab Results  Component Value Date   WBC 9.2 04/01/2015   HGB 14.5 04/01/2015   HCT 42.1 04/01/2015   MCV 92.5 04/01/2015   PLT 241 04/01/2015  Lab Results  Component Value Date   NA 141 04/01/2015   K 4.0 04/01/2015   CO2 29 04/01/2015   GLUCOSE 94 04/01/2015   BUN 18 04/01/2015   CREATININE 0.98 04/01/2015   BILITOT 0.3 04/01/2015   ALKPHOS 42 04/01/2015   AST 16 04/01/2015   ALT 13 04/01/2015   PROT 6.6 04/01/2015   ALBUMIN 4.2 04/01/2015   CALCIUM 9.3 04/01/2015   GFR 74.53 09/08/2014   Lab Results  Component Value Date   CHOL 159 09/08/2014   Lab Results  Component Value Date   HDL 65.00 09/08/2014   Lab Results  Component Value Date   LDLCALC 68 09/08/2014   Lab Results  Component Value Date   TRIG 132.0 09/08/2014   Lab Results  Component Value Date   CHOLHDL 2 09/08/2014   No results found for: HGBA1C     Assessment & Plan:   Problem List Items Addressed This Visit    Back pain    Encouraged moist heat and gentle stretching as tolerated. May try NSAIDs and prescription meds as directed and report if symptoms worsen or seek immediate care. Try topical treatments      Relevant Medications   methocarbamol (ROBAXIN) 500 MG tablet   Constipation    Encouraged increased hydration and fiber in diet. Daily probiotics. If bowels not moving can use MOM 2 tbls po in 4 oz of warm prune juice by mouth every 2-3 days. If no results then repeat in 4 hours with  Dulcolax suppository pr, may repeat again in 4 more hours as needed. Seek care if symptoms worsen. Consider  daily Miralax and/or Dulcolax if symptoms persist.       Esophageal reflux - Primary    Avoid offending foods, start probiotics. Do not eat large meals in late evening and consider raising head of bed.       Insomnia    Encouraged good sleep hygiene such as dark, quiet room. No blue/green glowing lights such as computer screens in bedroom. No alcohol or stimulants in evening. Cut down on caffeine as able. Regular exercise is helpful but not just prior to bed time. Refill given on Ambien         I have discontinued Ms. Eisman's HYDROcodone-acetaminophen, buPROPion, and ciprofloxacin. I am also having her start on methocarbamol and buPROPion. Additionally, I am having her maintain her ketoconazole, triamcinolone cream, fexofenadine, albuterol, Hydrocortisone Ace-Pramoxine, pantoprazole, meloxicam, and zolpidem.  Meds ordered this encounter  Medications  . zolpidem (AMBIEN) 10 MG tablet    Sig: Take 1 tablet (10 mg total) by mouth at bedtime as needed for sleep.    Dispense:  15 tablet    Refill:  3  . methocarbamol (ROBAXIN) 500 MG tablet    Sig: Take 1 tablet (500 mg total) by mouth every 8 (eight) hours as needed for muscle spasms.    Dispense:  30 tablet    Refill:  1  . buPROPion (WELLBUTRIN XL) 150 MG 24 hr tablet    Sig: Take 1 tablet (150 mg total) by mouth daily.    Dispense:  30 tablet    Refill:  1     Danise Edge, MD

## 2015-07-04 ENCOUNTER — Other Ambulatory Visit: Payer: Self-pay | Admitting: Family

## 2015-07-06 NOTE — Telephone Encounter (Signed)
Advise last refill done by Sandford CrazeMelissa O'Sullivan

## 2015-07-21 IMAGING — US US SOFT TISSUE HEAD/NECK
1 series · 14 of 25 positions shown · non-contrast
Comparison: None.

CLINICAL DATA: Thyromegaly.

EXAM:
THYROID ULTRASOUND
TECHNIQUE: Ultrasound examination of the thyroid gland and adjacent soft
tissues was performed.

[Series 1: us soft tissue head/neck · 0.05mm/px · 14 of 31 slices shown]
[im 1/31]
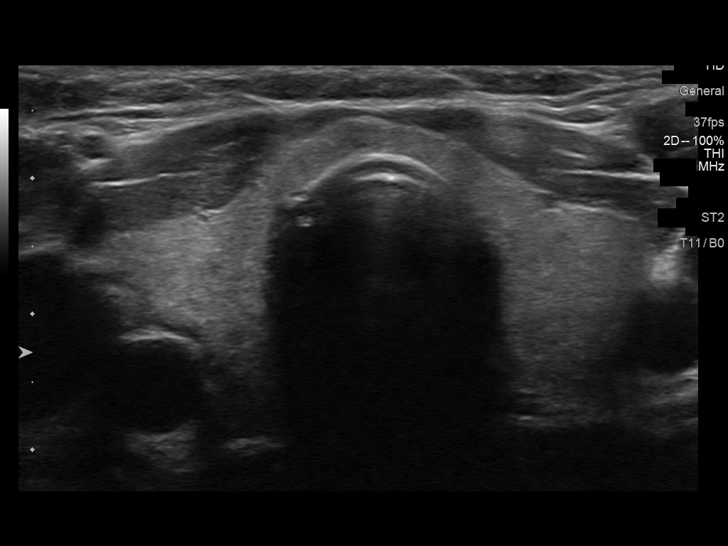
[im 3/31]
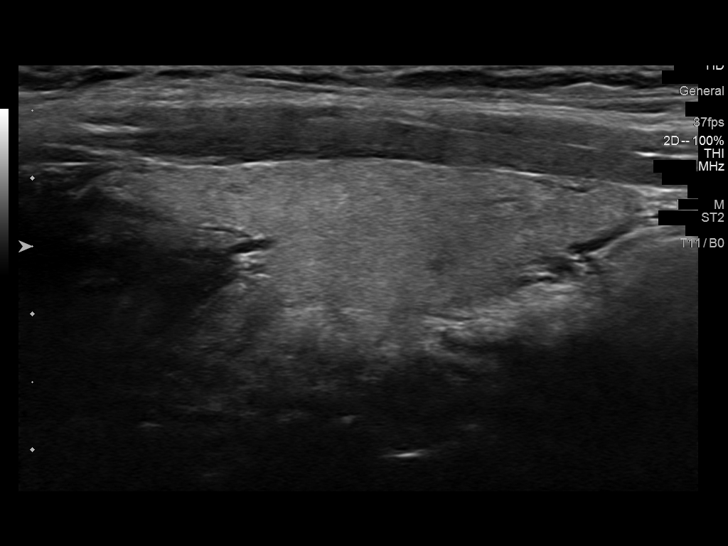
[im 6/31]
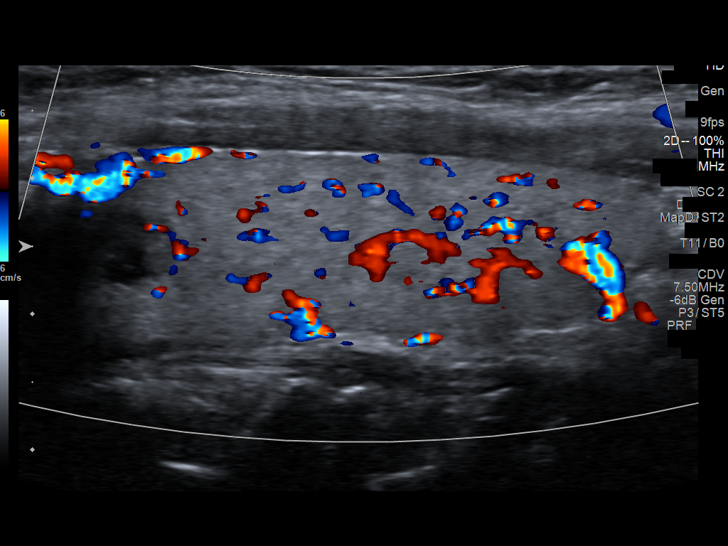
[im 8/31]
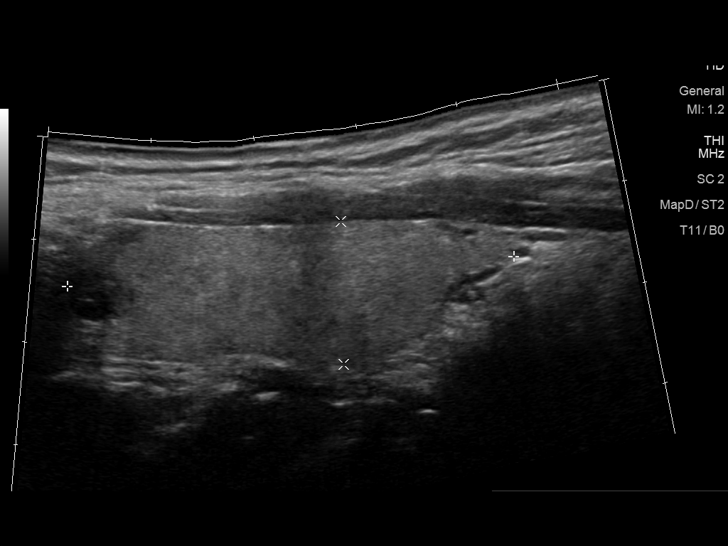
[im 11/31]
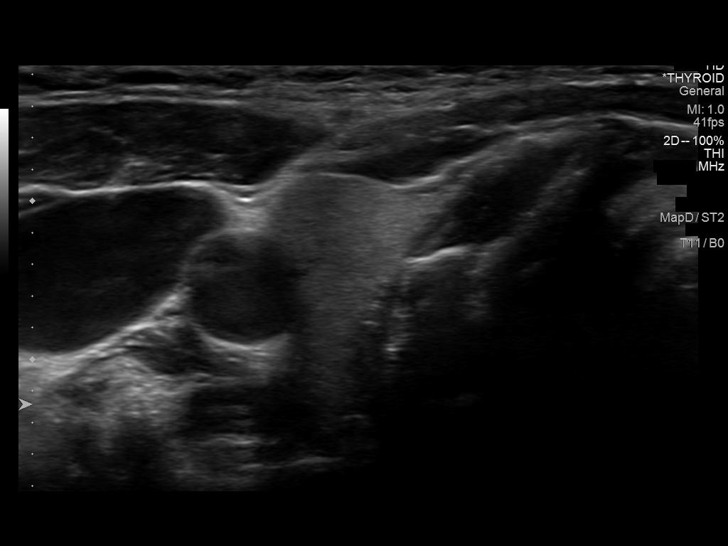
[im 12/31]
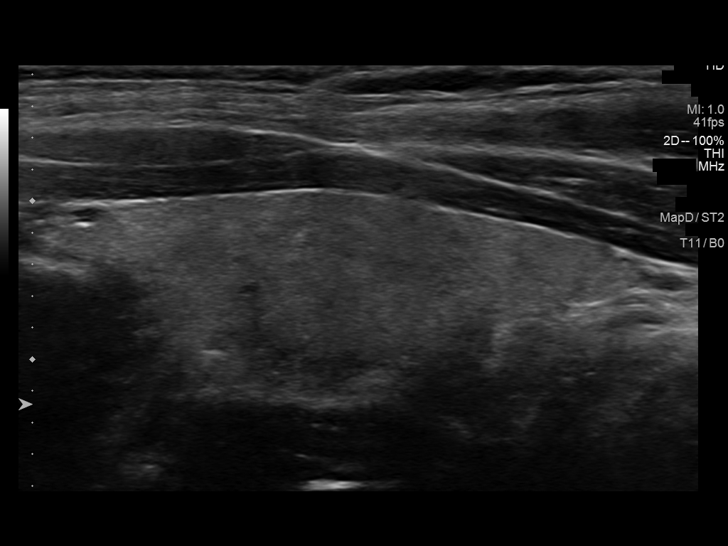
[im 14/31]
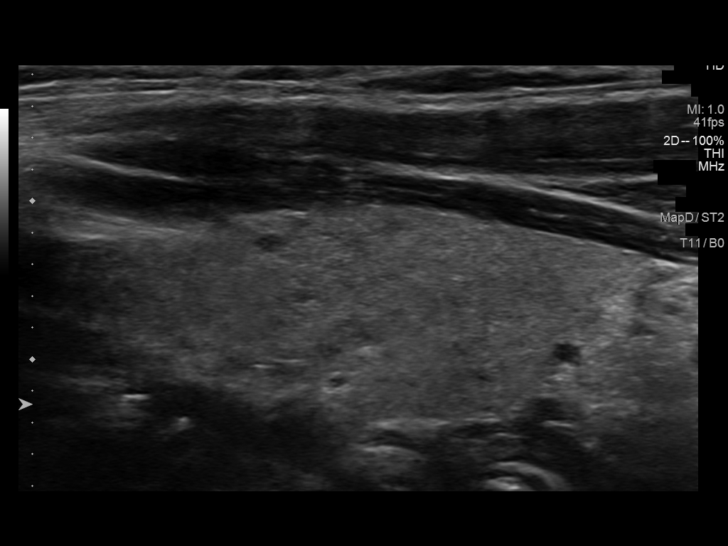
[im 17/31]
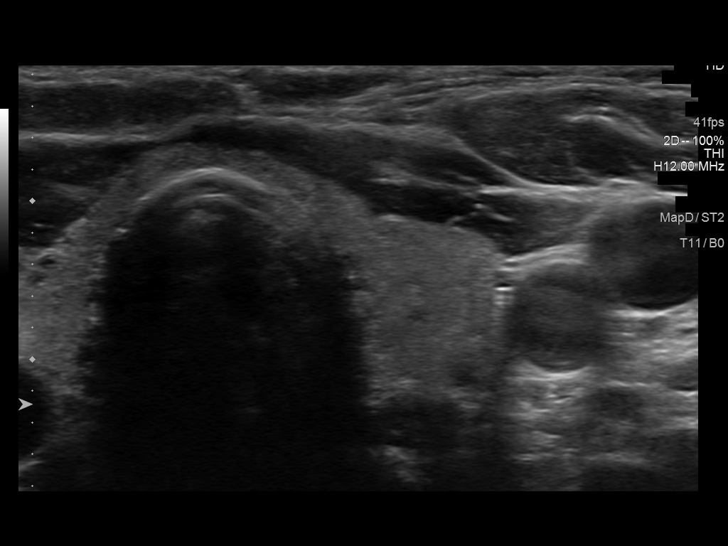
[im 19/31]
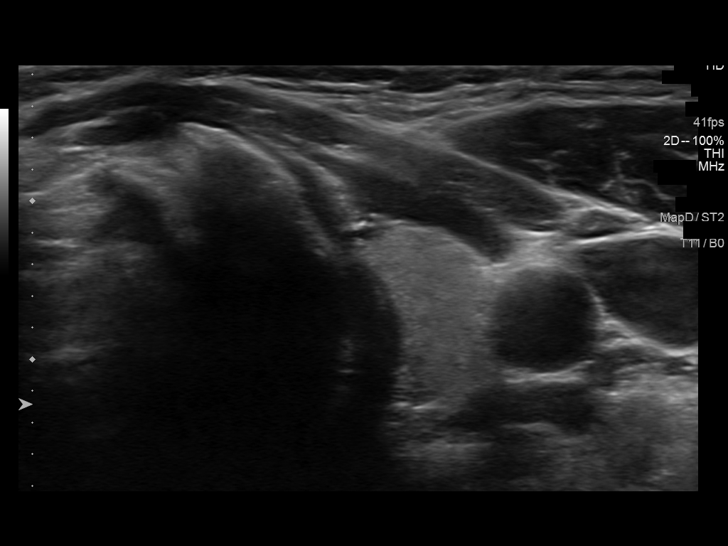
[im 21/31]
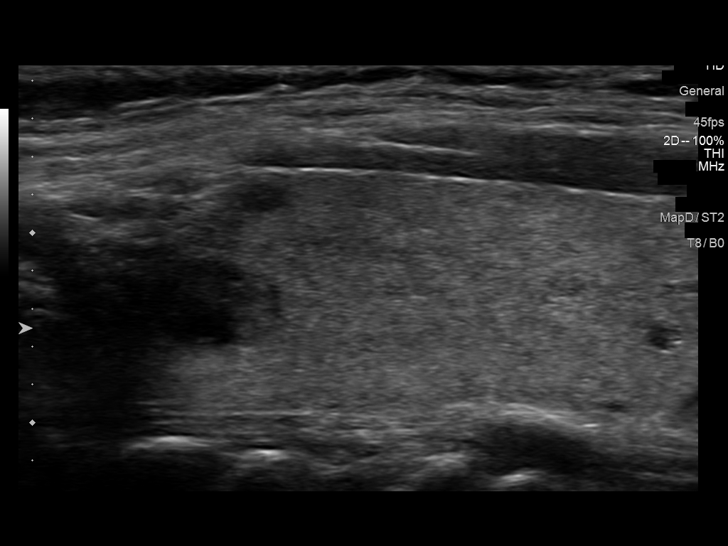
[im 23/31]
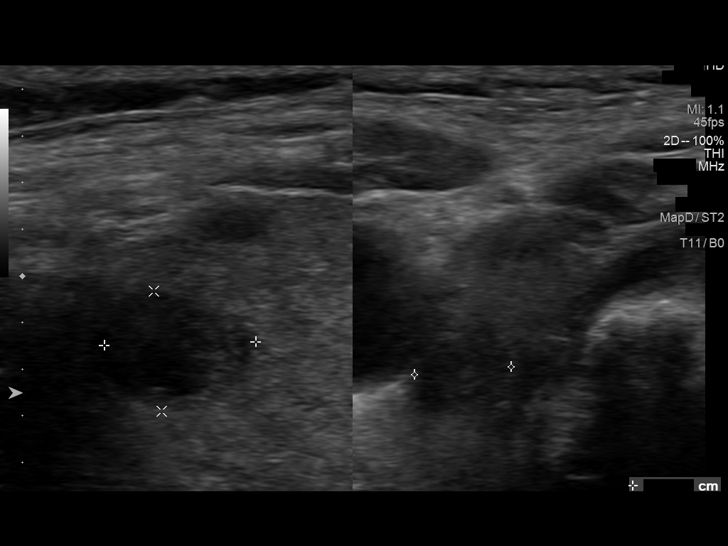
[im 26/31]
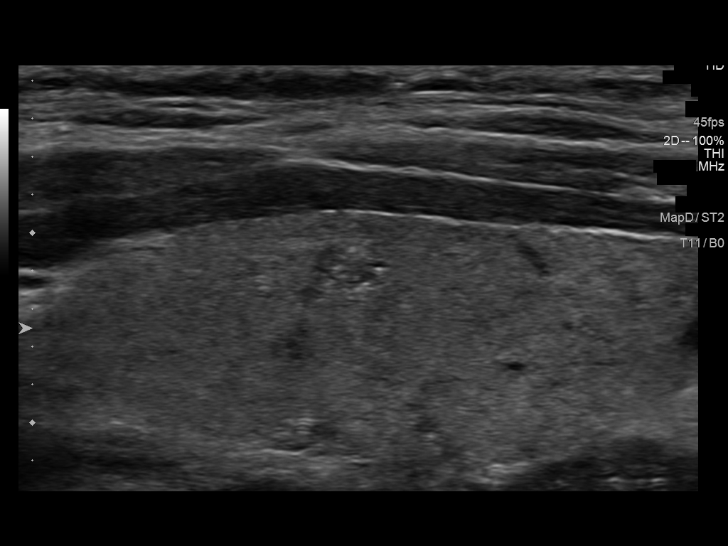
[im 28/31]
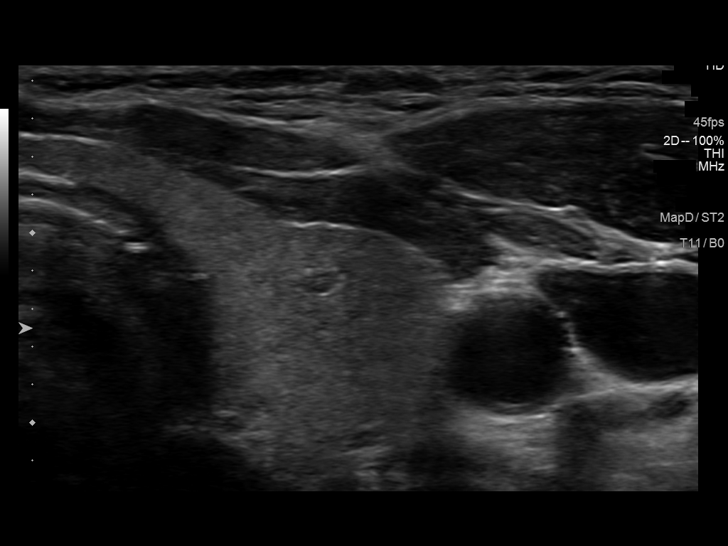
[im 31/31]
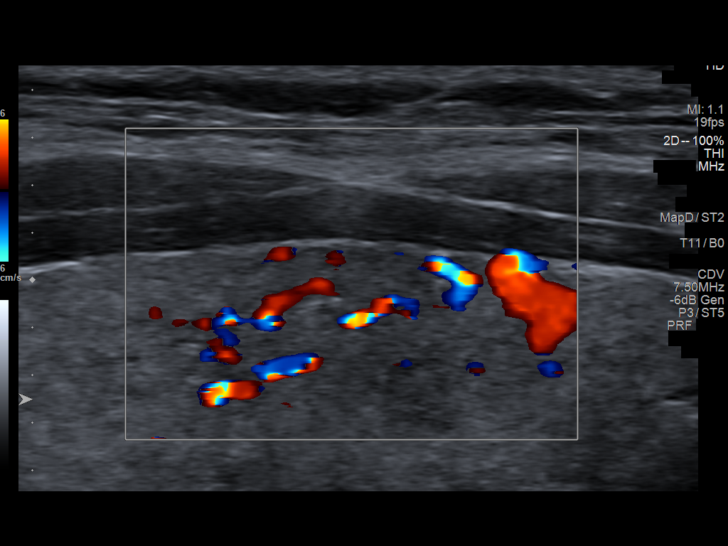

[14 of 25 positions shown; findings below may reference images not displayed]

FINDINGS: Right thyroid lobe

Measurements: 4.3 x 1.4 x 1.4 cm. Cystic nodule in the superior
right lobe measures 0.7 x 0.5 x 0.4 cm. This has the appearance of a
benign colloid nodule.

Left thyroid lobe

Measurements: 4.1 x 1.4 x 1.2 cm. Small solid nodule measures 0.4 x
0.2 x 0.3 cm. This nodule contains some areas of calcification.

Isthmus

Thickness: 0.2 cm.  No nodules visualized.

Lymphadenopathy

None visualized.
IMPRESSION: Normal sized thyroid gland. Subcentimeter cystic nodule in the right
lobe likely represents a benign colloid nodule. Small solid nodule
in the left lobe measures 0.4 cm in greatest diameter. This does not
meet size criteria for biopsy. Due to some areas of calcification,
follow-up ultrasound is recommended in roughly 6-12 months.

## 2015-08-17 ENCOUNTER — Other Ambulatory Visit: Payer: Self-pay | Admitting: Family Medicine

## 2015-08-23 ENCOUNTER — Encounter: Payer: Self-pay | Admitting: Women's Health

## 2015-09-20 ENCOUNTER — Encounter: Payer: Managed Care, Other (non HMO) | Admitting: Family Medicine

## 2015-10-28 ENCOUNTER — Ambulatory Visit (INDEPENDENT_AMBULATORY_CARE_PROVIDER_SITE_OTHER): Payer: Managed Care, Other (non HMO) | Admitting: Family Medicine

## 2015-10-28 ENCOUNTER — Encounter: Payer: Self-pay | Admitting: Family Medicine

## 2015-10-28 DIAGNOSIS — Z0000 Encounter for general adult medical examination without abnormal findings: Secondary | ICD-10-CM | POA: Diagnosis not present

## 2015-10-28 DIAGNOSIS — L309 Dermatitis, unspecified: Secondary | ICD-10-CM

## 2015-10-28 DIAGNOSIS — K219 Gastro-esophageal reflux disease without esophagitis: Secondary | ICD-10-CM | POA: Diagnosis not present

## 2015-10-28 DIAGNOSIS — R053 Chronic cough: Secondary | ICD-10-CM

## 2015-10-28 DIAGNOSIS — K5900 Constipation, unspecified: Secondary | ICD-10-CM | POA: Diagnosis not present

## 2015-10-28 DIAGNOSIS — R05 Cough: Secondary | ICD-10-CM

## 2015-10-28 HISTORY — DX: Dermatitis, unspecified: L30.9

## 2015-10-28 HISTORY — DX: Encounter for general adult medical examination without abnormal findings: Z00.00

## 2015-10-28 LAB — COMPREHENSIVE METABOLIC PANEL
ALBUMIN: 4.3 g/dL (ref 3.5–5.2)
ALK PHOS: 43 U/L (ref 39–117)
ALT: 25 U/L (ref 0–35)
AST: 16 U/L (ref 0–37)
BILIRUBIN TOTAL: 0.5 mg/dL (ref 0.2–1.2)
BUN: 27 mg/dL — AB (ref 6–23)
CO2: 31 mEq/L (ref 19–32)
Calcium: 9.5 mg/dL (ref 8.4–10.5)
Chloride: 96 mEq/L (ref 96–112)
Creatinine, Ser: 1.08 mg/dL (ref 0.40–1.20)
GFR: 56.29 mL/min — AB (ref >60.00)
Glucose, Bld: 88 mg/dL (ref 70–99)
POTASSIUM: 4.1 meq/L (ref 3.5–5.1)
SODIUM: 144 meq/L (ref 135–145)
TOTAL PROTEIN: 6.8 g/dL (ref 6.0–8.3)

## 2015-10-28 MED ORDER — ALBUTEROL SULFATE HFA 108 (90 BASE) MCG/ACT IN AERS: 2.0000 | INHALATION_SPRAY | Freq: Four times a day (QID) | RESPIRATORY_TRACT | Status: DC | PRN

## 2015-10-28 MED ORDER — NITROFURANTOIN MONOHYD MACRO 100 MG PO CAPS: 100.0000 mg | ORAL_CAPSULE | Freq: Two times a day (BID) | ORAL | Status: DC

## 2015-10-28 MED ORDER — MELOXICAM 7.5 MG PO TABS: 7.5000 mg | ORAL_TABLET | Freq: Every day | ORAL | Status: DC | PRN

## 2015-10-28 MED ORDER — ZOLPIDEM TARTRATE 10 MG PO TABS: 10.0000 mg | ORAL_TABLET | Freq: Every evening | ORAL | Status: DC | PRN

## 2015-10-28 MED ORDER — FEXOFENADINE HCL 30 MG PO TABS: 30.0000 mg | ORAL_TABLET | ORAL | Status: AC | PRN

## 2015-10-28 MED ORDER — METHOCARBAMOL 500 MG PO TABS: 500.0000 mg | ORAL_TABLET | Freq: Three times a day (TID) | ORAL | Status: DC | PRN

## 2015-10-28 MED ORDER — BUPROPION HCL ER (XL) 150 MG PO TB24: 150.0000 mg | ORAL_TABLET | Freq: Every day | ORAL | Status: DC

## 2015-10-28 NOTE — Progress Notes (Signed)
Pre visit review using our clinic review tool, if applicable. No additional management support is needed unless otherwise documented below in the visit note. 

## 2015-10-28 NOTE — Assessment & Plan Note (Addendum)
Patient encouraged to maintain heart healthy diet, regular exercise, adequate sleep. Consider daily probiotics. Take medications as prescribed. Given and reviewed copy of [employer] documents from [employer] and encouraged to complete and return. Follows with GYN, MGM up to date

## 2015-10-28 NOTE — Assessment & Plan Note (Signed)
Avoid offending foods, start probiotics. Do not eat large meals in late evening and consider raising head of bed. Has been using Protonix bid and has been work with [facility]. They have asked her to try and cut down to daily

## 2015-10-28 NOTE — Patient Instructions (Signed)
Preventive Care for Adults, Female A healthy lifestyle and preventive care can promote health and wellness. Preventive health guidelines for women include the following key practices.  A routine yearly physical is a good way to check with your health care provider about your health and preventive screening. It is a chance to share any concerns and updates on your health and to receive a thorough exam.  Visit your dentist for a routine exam and preventive care every 6 months. Brush your teeth twice a day and floss once a day. Good oral hygiene prevents tooth decay and gum disease.  The frequency of eye exams is based on your age, health, family medical history, use of contact lenses, and other factors. Follow your health care provider's recommendations for frequency of eye exams.  Eat a healthy diet. Foods like vegetables, fruits, whole grains, low-fat dairy products, and lean protein foods contain the nutrients you need without too many calories. Decrease your intake of foods high in solid fats, added sugars, and salt. Eat the right amount of calories for you.Get information about a proper diet from your health care provider, if necessary.  Regular physical exercise is one of the most important things you can do for your health. Most adults should get at least 150 minutes of moderate-intensity exercise (any activity that increases your heart rate and causes you to sweat) each week. In addition, most adults need muscle-strengthening exercises on 2 or more days a week.  Maintain a healthy weight. The body mass index (BMI) is a screening tool to identify possible weight problems. It provides an estimate of body fat based on height and weight. Your health care provider can find your BMI and can help you achieve or maintain a healthy weight.For adults 20 years and older:  A BMI below 18.5 is considered underweight.  A BMI of 18.5 to 24.9 is normal.  A BMI of 25 to 29.9 is considered overweight.  A  BMI of 30 and above is considered obese.  Maintain normal blood lipids and cholesterol levels by exercising and minimizing your intake of saturated fat. Eat a balanced diet with plenty of fruit and vegetables. Blood tests for lipids and cholesterol should begin at age 45 and be repeated every 5 years. If your lipid or cholesterol levels are high, you are over 50, or you are at high risk for heart disease, you may need your cholesterol levels checked more frequently.Ongoing high lipid and cholesterol levels should be treated with medicines if diet and exercise are not working.  If you smoke, find out from your health care provider how to quit. If you do not use tobacco, do not start.  Lung cancer screening is recommended for adults aged 45-80 years who are at high risk for developing lung cancer because of a history of smoking. A yearly low-dose CT scan of the lungs is recommended for people who have at least a 30-pack-year history of smoking and are a current smoker or have quit within the past 15 years. A pack year of smoking is smoking an average of 1 pack of cigarettes a day for 1 year (for example: 1 pack a day for 30 years or 2 packs a day for 15 years). Yearly screening should continue until the smoker has stopped smoking for at least 15 years. Yearly screening should be stopped for people who develop a health problem that would prevent them from having lung cancer treatment.  If you are pregnant, do not drink alcohol. If you are  breastfeeding, be very cautious about drinking alcohol. If you are not pregnant and choose to drink alcohol, do not have more than 1 drink per day. One drink is considered to be 12 ounces (355 mL) of beer, 5 ounces (148 mL) of wine, or 1.5 ounces (44 mL) of liquor.  Avoid use of street drugs. Do not share needles with anyone. Ask for help if you need support or instructions about stopping the use of drugs.  High blood pressure causes heart disease and increases the risk  of stroke. Your blood pressure should be checked at least every 1 to 2 years. Ongoing high blood pressure should be treated with medicines if weight loss and exercise do not work.  If you are 55-79 years old, ask your health care provider if you should take aspirin to prevent strokes.  Diabetes screening is done by taking a blood sample to check your blood glucose level after you have not eaten for a certain period of time (fasting). If you are not overweight and you do not have risk factors for diabetes, you should be screened once every 3 years starting at age 45. If you are overweight or obese and you are 40-70 years of age, you should be screened for diabetes every year as part of your cardiovascular risk assessment.  Breast cancer screening is essential preventive care for women. You should practice "breast self-awareness." This means understanding the normal appearance and feel of your breasts and may include breast self-examination. Any changes detected, no matter how small, should be reported to a health care provider. Women in their 20s and 30s should have a clinical breast exam (CBE) by a health care provider as part of a regular health exam every 1 to 3 years. After age 40, women should have a CBE every year. Starting at age 40, women should consider having a mammogram (breast X-ray test) every year. Women who have a family history of breast cancer should talk to their health care provider about genetic screening. Women at a high risk of breast cancer should talk to their health care providers about having an MRI and a mammogram every year.  Breast cancer gene (BRCA)-related cancer risk assessment is recommended for women who have family members with BRCA-related cancers. BRCA-related cancers include breast, ovarian, tubal, and peritoneal cancers. Having family members with these cancers may be associated with an increased risk for harmful changes (mutations) in the breast cancer genes BRCA1 and  BRCA2. Results of the assessment will determine the need for genetic counseling and BRCA1 and BRCA2 testing.  Your health care provider may recommend that you be screened regularly for cancer of the pelvic organs (ovaries, uterus, and vagina). This screening involves a pelvic examination, including checking for microscopic changes to the surface of your cervix (Pap test). You may be encouraged to have this screening done every 3 years, beginning at age 21.  For women ages 30-65, health care providers may recommend pelvic exams and Pap testing every 3 years, or they may recommend the Pap and pelvic exam, combined with testing for human papilloma virus (HPV), every 5 years. Some types of HPV increase your risk of cervical cancer. Testing for HPV may also be done on women of any age with unclear Pap test results.  Other health care providers may not recommend any screening for nonpregnant women who are considered low risk for pelvic cancer and who do not have symptoms. Ask your health care provider if a screening pelvic exam is right for   you.  If you have had past treatment for cervical cancer or a condition that could lead to cancer, you need Pap tests and screening for cancer for at least 20 years after your treatment. If Pap tests have been discontinued, your risk factors (such as having a new sexual partner) need to be reassessed to determine if screening should resume. Some women have medical problems that increase the chance of getting cervical cancer. In these cases, your health care provider may recommend more frequent screening and Pap tests.  Colorectal cancer can be detected and often prevented. Most routine colorectal cancer screening begins at the age of 50 years and continues through age 75 years. However, your health care provider may recommend screening at an earlier age if you have risk factors for colon cancer. On a yearly basis, your health care provider may provide home test kits to check  for hidden blood in the stool. Use of a small camera at the end of a tube, to directly examine the colon (sigmoidoscopy or colonoscopy), can detect the earliest forms of colorectal cancer. Talk to your health care provider about this at age 50, when routine screening begins. Direct exam of the colon should be repeated every 5-10 years through age 75 years, unless early forms of precancerous polyps or small growths are found.  People who are at an increased risk for hepatitis B should be screened for this virus. You are considered at high risk for hepatitis B if:  You were born in a country where hepatitis B occurs often. Talk with your health care provider about which countries are considered high risk.  Your parents were born in a high-risk country and you have not received a shot to protect against hepatitis B (hepatitis B vaccine).  You have HIV or AIDS.  You use needles to inject street drugs.  You live with, or have sex with, someone who has hepatitis B.  You get hemodialysis treatment.  You take certain medicines for conditions like cancer, organ transplantation, and autoimmune conditions.  Hepatitis C blood testing is recommended for all people born from 1945 through 1965 and any individual with known risks for hepatitis C.  Practice safe sex. Use condoms and avoid high-risk sexual practices to reduce the spread of sexually transmitted infections (STIs). STIs include gonorrhea, chlamydia, syphilis, trichomonas, herpes, HPV, and human immunodeficiency virus (HIV). Herpes, HIV, and HPV are viral illnesses that have no cure. They can result in disability, cancer, and death.  You should be screened for sexually transmitted illnesses (STIs) including gonorrhea and chlamydia if:  You are sexually active and are younger than 24 years.  You are older than 24 years and your health care provider tells you that you are at risk for this type of infection.  Your sexual activity has changed  since you were last screened and you are at an increased risk for chlamydia or gonorrhea. Ask your health care provider if you are at risk.  If you are at risk of being infected with HIV, it is recommended that you take a prescription medicine daily to prevent HIV infection. This is called preexposure prophylaxis (PrEP). You are considered at risk if:  You are sexually active and do not regularly use condoms or know the HIV status of your partner(s).  You take drugs by injection.  You are sexually active with a partner who has HIV.  Talk with your health care provider about whether you are at high risk of being infected with HIV. If   you choose to begin PrEP, you should first be tested for HIV. You should then be tested every 3 months for as long as you are taking PrEP.  Osteoporosis is a disease in which the bones lose minerals and strength with aging. This can result in serious bone fractures or breaks. The risk of osteoporosis can be identified using a bone density scan. Women ages 67 years and over and women at risk for fractures or osteoporosis should discuss screening with their health care providers. Ask your health care provider whether you should take a calcium supplement or vitamin D to reduce the rate of osteoporosis.  Menopause can be associated with physical symptoms and risks. Hormone replacement therapy is available to decrease symptoms and risks. You should talk to your health care provider about whether hormone replacement therapy is right for you.  Use sunscreen. Apply sunscreen liberally and repeatedly throughout the day. You should seek shade when your shadow is shorter than you. Protect yourself by wearing long sleeves, pants, a wide-brimmed hat, and sunglasses year round, whenever you are outdoors.  Once a month, do a whole body skin exam, using a mirror to look at the skin on your back. Tell your health care provider of new moles, moles that have irregular borders, moles that  are larger than a pencil eraser, or moles that have changed in shape or color.  Stay current with required vaccines (immunizations).  Influenza vaccine. All adults should be immunized every year.  Tetanus, diphtheria, and acellular pertussis (Td, Tdap) vaccine. Pregnant women should receive 1 dose of Tdap vaccine during each pregnancy. The dose should be obtained regardless of the length of time since the last dose. Immunization is preferred during the 27th-36th week of gestation. An adult who has not previously received Tdap or who does not know her vaccine status should receive 1 dose of Tdap. This initial dose should be followed by tetanus and diphtheria toxoids (Td) booster doses every 10 years. Adults with an unknown or incomplete history of completing a 3-dose immunization series with Td-containing vaccines should begin or complete a primary immunization series including a Tdap dose. Adults should receive a Td booster every 10 years.  Varicella vaccine. An adult without evidence of immunity to varicella should receive 2 doses or a second dose if she has previously received 1 dose. Pregnant females who do not have evidence of immunity should receive the first dose after pregnancy. This first dose should be obtained before leaving the health care facility. The second dose should be obtained 4-8 weeks after the first dose.  Human papillomavirus (HPV) vaccine. Females aged 13-26 years who have not received the vaccine previously should obtain the 3-dose series. The vaccine is not recommended for use in pregnant females. However, pregnancy testing is not needed before receiving a dose. If a female is found to be pregnant after receiving a dose, no treatment is needed. In that case, the remaining doses should be delayed until after the pregnancy. Immunization is recommended for any person with an immunocompromised condition through the age of 61 years if she did not get any or all doses earlier. During the  3-dose series, the second dose should be obtained 4-8 weeks after the first dose. The third dose should be obtained 24 weeks after the first dose and 16 weeks after the second dose.  Zoster vaccine. One dose is recommended for adults aged 30 years or older unless certain conditions are present.  Measles, mumps, and rubella (MMR) vaccine. Adults born  before 1957 generally are considered immune to measles and mumps. Adults born in 1957 or later should have 1 or more doses of MMR vaccine unless there is a contraindication to the vaccine or there is laboratory evidence of immunity to each of the three diseases. A routine second dose of MMR vaccine should be obtained at least 28 days after the first dose for students attending postsecondary schools, health care workers, or international travelers. People who received inactivated measles vaccine or an unknown type of measles vaccine during 1963-1967 should receive 2 doses of MMR vaccine. People who received inactivated mumps vaccine or an unknown type of mumps vaccine before 1979 and are at high risk for mumps infection should consider immunization with 2 doses of MMR vaccine. For females of childbearing age, rubella immunity should be determined. If there is no evidence of immunity, females who are not pregnant should be vaccinated. If there is no evidence of immunity, females who are pregnant should delay immunization until after pregnancy. Unvaccinated health care workers born before 1957 who lack laboratory evidence of measles, mumps, or rubella immunity or laboratory confirmation of disease should consider measles and mumps immunization with 2 doses of MMR vaccine or rubella immunization with 1 dose of MMR vaccine.  Pneumococcal 13-valent conjugate (PCV13) vaccine. When indicated, a person who is uncertain of his immunization history and has no record of immunization should receive the PCV13 vaccine. All adults 65 years of age and older should receive this  vaccine. An adult aged 19 years or older who has certain medical conditions and has not been previously immunized should receive 1 dose of PCV13 vaccine. This PCV13 should be followed with a dose of pneumococcal polysaccharide (PPSV23) vaccine. Adults who are at high risk for pneumococcal disease should obtain the PPSV23 vaccine at least 8 weeks after the dose of PCV13 vaccine. Adults older than 54 years of age who have normal immune system function should obtain the PPSV23 vaccine dose at least 1 year after the dose of PCV13 vaccine.  Pneumococcal polysaccharide (PPSV23) vaccine. When PCV13 is also indicated, PCV13 should be obtained first. All adults aged 65 years and older should be immunized. An adult younger than age 65 years who has certain medical conditions should be immunized. Any person who resides in a nursing home or long-term care facility should be immunized. An adult smoker should be immunized. People with an immunocompromised condition and certain other conditions should receive both PCV13 and PPSV23 vaccines. People with human immunodeficiency virus (HIV) infection should be immunized as soon as possible after diagnosis. Immunization during chemotherapy or radiation therapy should be avoided. Routine use of PPSV23 vaccine is not recommended for American Indians, [place] Natives, or people younger than 65 years unless there are medical conditions that require PPSV23 vaccine. When indicated, people who have unknown immunization and have no record of immunization should receive PPSV23 vaccine. One-time revaccination 5 years after the first dose of PPSV23 is recommended for people aged 19-64 years who have chronic kidney failure, nephrotic syndrome, asplenia, or immunocompromised conditions. People who received 1-2 doses of PPSV23 before age 65 years should receive another dose of PPSV23 vaccine at age 65 years or later if at least 5 years have passed since the previous dose. Doses of PPSV23 are not  needed for people immunized with PPSV23 at or after age 65 years.  Meningococcal vaccine. Adults with asplenia or persistent complement component deficiencies should receive 2 doses of quadrivalent meningococcal conjugate (MenACWY-D) vaccine. The doses should be obtained   at least 2 months apart. Microbiologists working with certain meningococcal bacteria, [place] recruits, people at risk during an outbreak, and people who travel to or live in countries with a high rate of meningitis should be immunized. A first-year college student up through age 34 years who is living in a residence hall should receive a dose if she did not receive a dose on or after her 16th birthday. Adults who have certain high-risk conditions should receive one or more doses of vaccine.  Hepatitis A vaccine. Adults who wish to be protected from this disease, have certain high-risk conditions, work with hepatitis A-infected animals, work in hepatitis A research labs, or travel to or work in countries with a high rate of hepatitis A should be immunized. Adults who were previously unvaccinated and who anticipate close contact with an international adoptee during the first 60 days after arrival in the [place] from a country with a high rate of hepatitis A should be immunized.  Hepatitis B vaccine. Adults who wish to be protected from this disease, have certain high-risk conditions, may be exposed to blood or other infectious body fluids, are household contacts or sex partners of hepatitis B positive people, are clients or workers in certain care facilities, or travel to or work in countries with a high rate of hepatitis B should be immunized.  Haemophilus influenzae type b (Hib) vaccine. A previously unvaccinated person with asplenia or sickle cell disease or having a scheduled splenectomy should receive 1 dose of Hib vaccine. Regardless of previous immunization, a recipient of a hematopoietic stem cell transplant should receive a  3-dose series 6-12 months after her successful transplant. Hib vaccine is not recommended for adults with HIV infection. Preventive Services / Frequency Ages 35 to 4 years  Blood pressure check.** / Every 3-5 years.  Lipid and cholesterol check.** / Every 5 years beginning at age 60.  Clinical breast exam.** / Every 3 years for women in their 71s and 10s.  BRCA-related cancer risk assessment.** / For women who have family members with a BRCA-related cancer (breast, ovarian, tubal, or peritoneal cancers).  Pap test.** / Every 2 years from ages 76 through 26. Every 3 years starting at age 61 through age 76 or 93 with a history of 3 consecutive normal Pap tests.  HPV screening.** / Every 3 years from ages 37 through ages 60 to 51 with a history of 3 consecutive normal Pap tests.  Hepatitis C blood test.** / For any individual with known risks for hepatitis C.  Skin self-exam. / Monthly.  Influenza vaccine. / Every year.  Tetanus, diphtheria, and acellular pertussis (Tdap, Td) vaccine.** / Consult your health care provider. Pregnant women should receive 1 dose of Tdap vaccine during each pregnancy. 1 dose of Td every 10 years.  Varicella vaccine.** / Consult your health care provider. Pregnant females who do not have evidence of immunity should receive the first dose after pregnancy.  HPV vaccine. / 3 doses over 6 months, if 93 and younger. The vaccine is not recommended for use in pregnant females. However, pregnancy testing is not needed before receiving a dose.  Measles, mumps, rubella (MMR) vaccine.** / You need at least 1 dose of MMR if you were born in 1957 or later. You may also need a 2nd dose. For females of childbearing age, rubella immunity should be determined. If there is no evidence of immunity, females who are not pregnant should be vaccinated. If there is no evidence of immunity, females who are  pregnant should delay immunization until after pregnancy.  Pneumococcal  13-valent conjugate (PCV13) vaccine.** / Consult your health care provider.  Pneumococcal polysaccharide (PPSV23) vaccine.** / 1 to 2 doses if you smoke cigarettes or if you have certain conditions.  Meningococcal vaccine.** / 1 dose if you are age 68 to 8 years and a Market researcher living in a residence hall, or have one of several medical conditions, you need to get vaccinated against meningococcal disease. You may also need additional booster doses.  Hepatitis A vaccine.** / Consult your health care provider.  Hepatitis B vaccine.** / Consult your health care provider.  Haemophilus influenzae type b (Hib) vaccine.** / Consult your health care provider. Ages 7 to 53 years  Blood pressure check.** / Every year.  Lipid and cholesterol check.** / Every 5 years beginning at age 25 years.  Lung cancer screening. / Every year if you are aged 11-80 years and have a 30-pack-year history of smoking and currently smoke or have quit within the past 15 years. Yearly screening is stopped once you have quit smoking for at least 15 years or develop a health problem that would prevent you from having lung cancer treatment.  Clinical breast exam.** / Every year after age 48 years.  BRCA-related cancer risk assessment.** / For women who have family members with a BRCA-related cancer (breast, ovarian, tubal, or peritoneal cancers).  Mammogram.** / Every year beginning at age 41 years and continuing for as long as you are in good health. Consult with your health care provider.  Pap test.** / Every 3 years starting at age 65 years through age 37 or 70 years with a history of 3 consecutive normal Pap tests.  HPV screening.** / Every 3 years from ages 72 years through ages 60 to 40 years with a history of 3 consecutive normal Pap tests.  Fecal occult blood test (FOBT) of stool. / Every year beginning at age 21 years and continuing until age 5 years. You may not need to do this test if you get  a colonoscopy every 10 years.  Flexible sigmoidoscopy or colonoscopy.** / Every 5 years for a flexible sigmoidoscopy or every 10 years for a colonoscopy beginning at age 35 years and continuing until age 48 years.  Hepatitis C blood test.** / For all people born from [birth year] through 1965 and any individual with known risks for hepatitis C.  Skin self-exam. / Monthly.  Influenza vaccine. / Every year.  Tetanus, diphtheria, and acellular pertussis (Tdap/Td) vaccine.** / Consult your health care provider. Pregnant women should receive 1 dose of Tdap vaccine during each pregnancy. 1 dose of Td every 10 years.  Varicella vaccine.** / Consult your health care provider. Pregnant females who do not have evidence of immunity should receive the first dose after pregnancy.  Zoster vaccine.** / 1 dose for adults aged 30 years or older.  Measles, mumps, rubella (MMR) vaccine.** / You need at least 1 dose of MMR if you were born in 1957 or later. You may also need a second dose. For females of childbearing age, rubella immunity should be determined. If there is no evidence of immunity, females who are not pregnant should be vaccinated. If there is no evidence of immunity, females who are pregnant should delay immunization until after pregnancy.  Pneumococcal 13-valent conjugate (PCV13) vaccine.** / Consult your health care provider.  Pneumococcal polysaccharide (PPSV23) vaccine.** / 1 to 2 doses if you smoke cigarettes or if you have certain conditions.  Meningococcal vaccine.** /  Consult your health care provider.  Hepatitis A vaccine.** / Consult your health care provider.  Hepatitis B vaccine.** / Consult your health care provider.  Haemophilus influenzae type b (Hib) vaccine.** / Consult your health care provider. Ages 64 years and over  Blood pressure check.** / Every year.  Lipid and cholesterol check.** / Every 5 years beginning at age 23 years.  Lung cancer screening. / Every year if you  are aged 16-80 years and have a 30-pack-year history of smoking and currently smoke or have quit within the past 15 years. Yearly screening is stopped once you have quit smoking for at least 15 years or develop a health problem that would prevent you from having lung cancer treatment.  Clinical breast exam.** / Every year after age 74 years.  BRCA-related cancer risk assessment.** / For women who have family members with a BRCA-related cancer (breast, ovarian, tubal, or peritoneal cancers).  Mammogram.** / Every year beginning at age 44 years and continuing for as long as you are in good health. Consult with your health care provider.  Pap test.** / Every 3 years starting at age 58 years through age 22 or 39 years with 3 consecutive normal Pap tests. Testing can be stopped between 65 and 70 years with 3 consecutive normal Pap tests and no abnormal Pap or HPV tests in the past 10 years.  HPV screening.** / Every 3 years from ages 64 years through ages 70 or 61 years with a history of 3 consecutive normal Pap tests. Testing can be stopped between 65 and 70 years with 3 consecutive normal Pap tests and no abnormal Pap or HPV tests in the past 10 years.  Fecal occult blood test (FOBT) of stool. / Every year beginning at age 40 years and continuing until age 27 years. You may not need to do this test if you get a colonoscopy every 10 years.  Flexible sigmoidoscopy or colonoscopy.** / Every 5 years for a flexible sigmoidoscopy or every 10 years for a colonoscopy beginning at age 7 years and continuing until age 32 years.  Hepatitis C blood test.** / For all people born from [birth year] through 1965 and any individual with known risks for hepatitis C.  Osteoporosis screening.** / A one-time screening for women ages 30 years and over and women at risk for fractures or osteoporosis.  Skin self-exam. / Monthly.  Influenza vaccine. / Every year.  Tetanus, diphtheria, and acellular pertussis (Tdap/Td)  vaccine.** / 1 dose of Td every 10 years.  Varicella vaccine.** / Consult your health care provider.  Zoster vaccine.** / 1 dose for adults aged 35 years or older.  Pneumococcal 13-valent conjugate (PCV13) vaccine.** / Consult your health care provider.  Pneumococcal polysaccharide (PPSV23) vaccine.** / 1 dose for all adults aged 46 years and older.  Meningococcal vaccine.** / Consult your health care provider.  Hepatitis A vaccine.** / Consult your health care provider.  Hepatitis B vaccine.** / Consult your health care provider.  Haemophilus influenzae type b (Hib) vaccine.** / Consult your health care provider. ** Family history and personal history of risk and conditions may change your health care provider's recommendations.   This information is not intended to replace advice given to you by your health care provider. Make sure you discuss any questions you have with your health care provider.   Document Released: 05/30/2001 Document Revised: 04/24/2014 Document Reviewed: 08/29/2010 Elsevier Interactive Patient Education [employer].

## 2015-11-12 ENCOUNTER — Encounter: Payer: Managed Care, Other (non HMO) | Admitting: Women's Health

## 2015-11-14 NOTE — Assessment & Plan Note (Signed)
Likely multifactorial improved some. Encouraged to continue to mange reflux and allergies.

## 2015-11-14 NOTE — Progress Notes (Signed)
Patient ID: Patricia Roy, female   DOB: 03/07/1961, 55 y.o.   MRN: [id number]   Subjective:    Patient ID: Patricia Roy, female    DOB: 09/10/1960, 55 y.o.   MRN: [id number]  Chief Complaint  Patient presents with  . Annual Exam    HPI Patient is in today for annual exam and follow up on numerous concerns. Is still struggling with cough but it has improved. No cigarettes since January of 2017, [facility] has diagnosed her with silent reflux and she is using Protonix. Cough occurs with exertion at times and is helped by Albuterol. Fiber gummies and probiotics have helped constipation. She follows with [facility], Elon Alas, NP< [facility] was at [place] in April of 2017. Also had some recent back painw ith UTI but that has improved and also notes some Left shoulder pain, achy but no weakness, no recent injury or trauma. Denies CP/palp/SOB/HA/fevers or GU c/o. Taking meds as prescribed  Past Medical History:  Diagnosis Date  . Back pain 06/27/2015  . Blood stool    Hemorrhoids  . Chronic cough 09/08/2014  . Constipation 06/27/2015  . Dermatitis 10/28/2015  . Esophageal reflux 09/19/2014  . Hx: UTI (urinary tract infection) 09/08/2014  . Insomnia 09/19/2014  . Internal hemorrhoid 09/08/2014  . Multiple thyroid nodules 09/19/2014   and vocal cord nodules  . Preventative health care 10/28/2015  . Rectal bleeding 09/08/2014  . 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 Urine incontinence     Past Surgical History:  Procedure Laterality Date  . child birth  1992, 2000 and 2004   stitches each time  . TONSILLECTOMY      Family History  Problem Relation Age of Onset  . Heart disease Mother     H/O valve replacement at 65  . Stroke Mother   . Pulmonary embolism Mother   . Pancreatic cancer Father   . Alcohol abuse Father   . Alzheimer's disease Maternal Aunt   . Other Sister     chronic lyme  . Pulmonary embolism Brother     DVTs  . Alcohol abuse Brother     recovering  . Migraines Daughter     . Alcohol abuse Brother     recovering  . Alcohol abuse Sister     dementia  . Dementia Sister     Social History   Social History  . Marital status: Married    Spouse name: N/A  . Number of children: 3  . Years of education: 7   Occupational History  . Utilization Review for [employer]   Social History Main Topics  . Smoking status: Former Smoker    Packs/day: 0.50    Types: Cigarettes    Quit date: 04/19/2015  . Smokeless tobacco: Never Used  . Alcohol use 0.0 oz/week     Comment: Occasional  . Drug use: No  . Sexual activity: Yes     Comment: lives with husband (heart diseaseand duaghters, no dietary restrictions, works ortho [employer],...INTERCOURSE AGE 68, SEXUALPARTNERS LESS THAN 5   Other Topics Concern  . Not on file   Social History Narrative  . No narrative on file    Outpatient Medications Prior to Visit  Medication Sig Dispense Refill  . ketoconazole (NIZORAL) 2 % cream Reported on 06/17/2015  3  . pantoprazole (PROTONIX) 40 MG tablet Take 40 mg by mouth 2 (two) times daily before a meal.     . triamcinolone cream (KENALOG) 0.1 % Reported on  06/17/2015  3  . albuterol (PROVENTIL HFA;VENTOLIN HFA) 108 (90 BASE) MCG/ACT inhaler Inhale 2 puffs into the lungs every 6 (six) hours as needed for wheezing or shortness of breath. 1 Inhaler 3  . buPROPion (WELLBUTRIN XL) 150 MG 24 hr tablet TAKE 1 TABLET(150 MG) BY MOUTH DAILY 30 tablet 5  . fexofenadine (ALLEGRA) 30 MG tablet Take 30 mg by mouth as needed.    . meloxicam (MOBIC) 7.5 MG tablet TAKE 1 TABLET(7.5 MG) BY MOUTH DAILY 20 tablet 0  . methocarbamol (ROBAXIN) 500 MG tablet Take 1 tablet (500 mg total) by mouth every 8 (eight) hours as needed for muscle spasms. 30 tablet 1  . zolpidem (AMBIEN) 10 MG tablet Take 1 tablet (10 mg total) by mouth at bedtime as needed for sleep. 15 tablet 3  . Pramoxine-HC (HYDROCORTISONE ACE-PRAMOXINE) 2.5-1 % CREA Apply 1 application topically 3 (three) times daily.  (Patient not taking: Reported on 06/17/2015) 1 Tube 1   No facility-administered medications prior to visit.     Allergies  Allergen Reactions  . Paxil [Paroxetine]   . Sulfonamide Derivatives     Review of Systems  Constitutional: Negative for fever and malaise/fatigue.  HENT: Positive for congestion.   Eyes: Negative for blurred vision.  Respiratory: Positive for cough. Negative for hemoptysis and shortness of breath.   Cardiovascular: Negative for chest pain, palpitations and leg swelling.  Gastrointestinal: Positive for constipation. Negative for abdominal pain, blood in stool and nausea.  Genitourinary: Negative for dysuria and frequency.  Musculoskeletal: Positive for joint pain. Negative for falls.  Skin: Negative for rash.  Neurological: Negative for dizziness, loss of consciousness and headaches.  Endo/Heme/Allergies: Negative for environmental allergies.  Psychiatric/Behavioral: Negative for depression. The patient is not nervous/anxious.        Objective:    Physical Exam  Constitutional: She is oriented to person, place, and time. She appears well-developed and well-nourished. No distress.  HENT:  Head: Normocephalic and atraumatic.  Eyes: Conjunctivae are normal.  Neck: Neck supple. No thyromegaly present.  Cardiovascular: Normal rate, regular rhythm and normal heart sounds.   No murmur heard. Pulmonary/Chest: Effort normal and breath sounds normal. No respiratory distress.  Abdominal: Soft. Bowel sounds are normal. She exhibits no distension and no mass. There is no tenderness.  Musculoskeletal: She exhibits no edema.  Lymphadenopathy:    She has no cervical adenopathy.  Neurological: She is alert and oriented to person, place, and time.  Skin: Skin is warm and dry.  Psychiatric: She has a normal mood and affect. Her behavior is normal.    BP 108/72   Pulse 90   Temp 97.9 F (36.6 C) (Oral)   Ht '5\' 4"'$  (1.626 m)   Wt 148 lb 8 oz (67.4 kg)   LMP  09/08/2011   SpO2 94%   BMI 25.49 kg/m  Wt Readings from Last 3 Encounters:  10/28/15 148 lb 8 oz (67.4 kg)  06/17/15 146 lb 6 oz (66.4 kg)  06/14/15 146 lb 12.8 oz (66.6 kg)     Lab Results  Component Value Date   WBC 9.2 04/01/2015   HGB 14.5 04/01/2015   HCT 42.1 04/01/2015   PLT 241 04/01/2015   GLUCOSE 88 10/28/2015   CHOL 159 09/08/2014   TRIG 132.0 09/08/2014   HDL 65.00 09/08/2014   LDLCALC 68 09/08/2014   ALT 25 10/28/2015   AST 16 10/28/2015   NA 144 10/28/2015   K 4.1 10/28/2015   CL 96 10/28/2015  CREATININE 1.08 10/28/2015   BUN 27 (H) 10/28/2015   CO2 31 10/28/2015   TSH 1.07 09/08/2014    Lab Results  Component Value Date   TSH 1.07 09/08/2014   Lab Results  Component Value Date   WBC 9.2 04/01/2015   HGB 14.5 04/01/2015   HCT 42.1 04/01/2015   MCV 92.5 04/01/2015   PLT 241 04/01/2015   Lab Results  Component Value Date   NA 144 10/28/2015   K 4.1 10/28/2015   CO2 31 10/28/2015   GLUCOSE 88 10/28/2015   BUN 27 (H) 10/28/2015   CREATININE 1.08 10/28/2015   BILITOT 0.5 10/28/2015   ALKPHOS 43 10/28/2015   AST 16 10/28/2015   ALT 25 10/28/2015   PROT 6.8 10/28/2015   ALBUMIN 4.3 10/28/2015   CALCIUM 9.5 10/28/2015   GFR 56.29 (L) 10/28/2015   Lab Results  Component Value Date   CHOL 159 09/08/2014   Lab Results  Component Value Date   HDL 65.00 09/08/2014   Lab Results  Component Value Date   LDLCALC 68 09/08/2014   Lab Results  Component Value Date   TRIG 132.0 09/08/2014   Lab Results  Component Value Date   CHOLHDL 2 09/08/2014   No results found for: HGBA1C     Assessment & Plan:   Problem List Items Addressed This Visit    Chronic cough    Likely multifactorial improved some. Encouraged to continue to mange reflux and allergies.       Esophageal reflux    Avoid offending foods, start probiotics. Do not eat large meals in late evening and consider raising head of bed. Has been using Protonix bid and has been  work with [facility]. They have asked her to try and cut down to daily      Constipation    Encouraged increased hydration and fiber in diet. Daily probiotics. If bowels not moving can use MOM 2 tbls po in 4 oz of warm prune juice by mouth every 2-3 days. If no results then repeat in 4 hours with  Dulcolax suppository pr, may repeat again in 4 more hours as needed. Seek care if symptoms worsen. Consider daily Miralax and/or Dulcolax prn      Dermatitis   Relevant Orders   Comp Met (CMET) (Completed)   Preventative health care    Patient encouraged to maintain heart healthy diet, regular exercise, adequate sleep. Consider daily probiotics. Take medications as prescribed. Given and reviewed copy of ACP documents from [employer] and encouraged to complete and return. Follows with GYN, MGM up to date       Other Visit Diagnoses   None.     I have discontinued Ms. Kuhl's Hydrocortisone Ace-Pramoxine. I have also changed her buPROPion, meloxicam, and fexofenadine. Additionally, I am having her start on nitrofurantoin (macrocrystal-monohydrate). Lastly, I am having her maintain her ketoconazole, triamcinolone cream, pantoprazole, methocarbamol, zolpidem, and albuterol.  Meds ordered this encounter  Medications  . buPROPion (WELLBUTRIN XL) 150 MG 24 hr tablet    Sig: Take 1 tablet (150 mg total) by mouth daily.    Dispense:  90 tablet    Refill:  1  . methocarbamol (ROBAXIN) 500 MG tablet    Sig: Take 1 tablet (500 mg total) by mouth every 8 (eight) hours as needed for muscle spasms.    Dispense:  30 tablet    Refill:  2  . meloxicam (MOBIC) 7.5 MG tablet    Sig: Take 1  tablet (7.5 mg total) by mouth daily as needed for pain.    Dispense:  30 tablet    Refill:  2  . zolpidem (AMBIEN) 10 MG tablet    Sig: Take 1 tablet (10 mg total) by mouth at bedtime as needed for sleep.    Dispense:  15 tablet    Refill:  5  . albuterol (PROVENTIL HFA;VENTOLIN HFA) 108 (90 Base) MCG/ACT  inhaler    Sig: Inhale 2 puffs into the lungs every 6 (six) hours as needed for wheezing or shortness of breath.    Dispense:  1 Inhaler    Refill:  3  . fexofenadine (ALLEGRA) 30 MG tablet    Sig: Take 1 tablet (30 mg total) by mouth as needed.  . nitrofurantoin, macrocrystal-monohydrate, (MACROBID) 100 MG capsule    Sig: Take 1 capsule (100 mg total) by mouth 2 (two) times daily. Prn UTI    Dispense:  6 capsule    Refill:  1     Tenna Lacko, MD

## 2015-11-14 NOTE — Assessment & Plan Note (Signed)
Encouraged increased hydration and fiber in diet. Daily probiotics. If bowels not moving can use MOM 2 tbls po in 4 oz of warm prune juice by mouth every 2-3 days. If no results then repeat in 4 hours with  Dulcolax suppository pr, may repeat again in 4 more hours as needed. Seek care if symptoms worsen. Consider daily Miralax and/or Dulcolax prn

## 2015-12-15 ENCOUNTER — Encounter: Payer: Self-pay | Admitting: Women's Health

## 2015-12-15 ENCOUNTER — Ambulatory Visit (INDEPENDENT_AMBULATORY_CARE_PROVIDER_SITE_OTHER): Payer: Managed Care, Other (non HMO) | Admitting: Women's Health

## 2015-12-15 VITALS — BP 122/80 | Ht 64.0 in | Wt 150.0 lb

## 2015-12-15 DIAGNOSIS — Z01419 Encounter for gynecological examination (general) (routine) without abnormal findings: Secondary | ICD-10-CM | POA: Diagnosis not present

## 2015-12-15 NOTE — Patient Instructions (Signed)

## 2015-12-15 NOTE — Progress Notes (Signed)
Patricia SniffCarol M Mory 09-27-1960 [id number]    History:    Presents for annual exam. Postmenopausal on no HRT with no bleeding. Normal Pap and mammogram history. 2016 negative colonoscopy. [facility] hemorrh[facility]2015[facility] Has quit smoking. Reports excellent heel DEXA done  at health screening at work.   Past medical history, past surgical history, family history and social history were all reviewed and documented in the EPIC chart. 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 Has 3 daughters ages 2124, 4717 and 8012 all doing well. Oldest to have received gardasil.  ROS:  A ROS was performed and pertinent positives and negatives are included.  Exam:  Vitals:   12/15/15 0832  BP: 122/80  Weight: 150 lb (68 kg)  Height: 5\' 4"  (1.626 m)   Body mass index is 25.75 kg/m.   General appearance:  Normal Thyroid:  Symmetrical, normal in size, without palpable masses or nodularity. Respiratory  Auscultation:  Clear without wheezing or rhonchi Cardiovascular  Auscultation:  Regular rate, without rubs, murmurs or gallops  Edema/varicosities:  Not grossly evident Abdominal  Soft,nontender, without masses, guarding or rebound.  Liver/spleen:  No organomegaly noted  Hernia:  None appreciated  Skin  Inspection:  Grossly normal   Breasts: Examined lying and sitting.     Right: Without masses, retractions, discharge or axillary adenopathy.     Left: Without masses, retractions, discharge or axillary adenopathy. Gentitourinary   Inguinal/mons:  Normal without inguinal adenopathy  External genitalia:  Normal  BUS/Urethra/Skene's glands:  Normal  Vagina:  Mild atrophy  Cervix:  Normal  Uterus:  normal in size, shape and contour.  Midline and mobile  Adnexa/parametria:     Rt: Without masses or tenderness.   Lt: Without masses or tenderness.  Anus and perineum: Normal  Digital rectal exam: Normal sphincter tone without palpated masses or  tenderness  Assessment/Plan:  55 y.o. MWF G3 P3 +2 stepsons for annual exam with no complaints.  Postmenopausal/no HRT/no bleeding Labs-primary care  Plan: SBE's, continue annual 3-D screening mammogram history of dense breasts. Exercise, calcium rich diet, vitamin D 1000 daily encouraged. Home safety, fall prevention and importance of weightbearing exercise reviewed. UA, Pap normal with negative HR HPV 2016, new screening guidelines reviewed.  Harrington ChallengerYOUNG,Rakesha Dalporto J [facility] St Alex[facility] Health WillistonWHNP, 9:53 AM 12/15/2015

## 2016-01-24 ENCOUNTER — Other Ambulatory Visit: Payer: Self-pay | Admitting: Family Medicine

## 2016-03-24 ENCOUNTER — Other Ambulatory Visit: Payer: Self-pay | Admitting: Family Medicine

## 2016-04-25 ENCOUNTER — Other Ambulatory Visit: Payer: Self-pay | Admitting: Family Medicine

## 2016-04-27 ENCOUNTER — Other Ambulatory Visit: Payer: Self-pay

## 2016-05-01 ENCOUNTER — Ambulatory Visit: Payer: Managed Care, Other (non HMO) | Admitting: Family Medicine

## 2016-05-02 ENCOUNTER — Ambulatory Visit (INDEPENDENT_AMBULATORY_CARE_PROVIDER_SITE_OTHER): Payer: Managed Care, Other (non HMO) | Admitting: Family Medicine

## 2016-05-02 ENCOUNTER — Encounter: Payer: Self-pay | Admitting: Family Medicine

## 2016-05-02 DIAGNOSIS — R05 Cough: Secondary | ICD-10-CM

## 2016-05-02 DIAGNOSIS — K5900 Constipation, unspecified: Secondary | ICD-10-CM

## 2016-05-02 DIAGNOSIS — G4700 Insomnia, unspecified: Secondary | ICD-10-CM | POA: Diagnosis not present

## 2016-05-02 DIAGNOSIS — K219 Gastro-esophageal reflux disease without esophagitis: Secondary | ICD-10-CM

## 2016-05-02 DIAGNOSIS — T7840XA Allergy, unspecified, initial encounter: Secondary | ICD-10-CM

## 2016-05-02 DIAGNOSIS — Z299 Encounter for prophylactic measures, unspecified: Secondary | ICD-10-CM

## 2016-05-02 DIAGNOSIS — T7840XD Allergy, unspecified, subsequent encounter: Secondary | ICD-10-CM

## 2016-05-02 DIAGNOSIS — L309 Dermatitis, unspecified: Secondary | ICD-10-CM

## 2016-05-02 DIAGNOSIS — R053 Chronic cough: Secondary | ICD-10-CM

## 2016-05-02 HISTORY — DX: Allergy, unspecified, initial encounter: T78.40XA

## 2016-05-02 MED ORDER — MONTELUKAST SODIUM 10 MG PO TABS: 10.0000 mg | ORAL_TABLET | Freq: Every day | ORAL | 3 refills | Status: DC

## 2016-05-02 MED ORDER — ZOLPIDEM TARTRATE 10 MG PO TABS: 10.0000 mg | ORAL_TABLET | Freq: Every evening | ORAL | 5 refills | Status: DC | PRN

## 2016-05-02 MED ORDER — MELOXICAM 7.5 MG PO TABS: ORAL_TABLET | ORAL | 0 refills | Status: DC

## 2016-05-02 NOTE — Progress Notes (Signed)
Subjective:    Patient ID: Patricia Roy, female    DOB: 02-02-1961, 56 y.o.   MRN: [id number]  Chief Complaint  Patient presents with  . Follow-up    HPI Patient is in today for follow up patient, she has been struggling with nasal congestion and sinus pressure. She had a bad reaction to medication and has struggled with dermatitis recently. Allegra D has been marginally helpful. No fevers or acute illness otherwise. Needs a refill on her Ambien which she uses infrequently. Denies CP/palp/SOB/HA/fevers/GI or GU c/o. Taking meds as prescribed  Past Medical History:  Diagnosis Date  . Allergic state 05/02/2016  . Back pain 06/27/2015  . Blood stool    Hemorrhoids  . Chronic cough 09/08/2014  . Constipation 06/27/2015  . Dermatitis 10/28/2015  . Esophageal reflux 09/19/2014  . Hx: UTI (urinary tract infection) 09/08/2014  . Insomnia 09/19/2014  . Internal hemorrhoid 09/08/2014  . Multiple thyroid nodules 09/19/2014   and vocal cord nodules  . Preventative health care 10/28/2015  . Rectal bleeding 09/08/2014  . 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 Urine incontinence     Past Surgical History:  Procedure Laterality Date  . child birth  1992, 2000 and 2004   stitches each time  . TONSILLECTOMY      Family History  Problem Relation Age of Onset  . Heart disease Mother     H/O valve replacement at 35  . Stroke Mother   . Pulmonary embolism Mother   . Pancreatic cancer Father   . Alcohol abuse Father   . Alzheimer's disease Maternal Aunt   . Other Sister     chronic lyme  . Pulmonary embolism Brother     DVTs  . Alcohol abuse Brother     recovering  . Migraines Daughter   . Alcohol abuse Brother     recovering  . Alcohol abuse Sister     dementia  . Dementia Sister     Social History   Social History  . Marital status: Married    Spouse name: N/A  . Number of children: 3  . Years of education: 46   Occupational History  . Utilization Review for [employer]    Social History Main Topics  . Smoking status: Former Smoker    Packs/day: 0.50    Types: Cigarettes    Quit date: 04/19/2015  . Smokeless tobacco: Never Used  . Alcohol use 0.0 oz/week     Comment: Occasional  . Drug use: No  . Sexual activity: Yes     Comment: lives with husband (heart diseaseand duaghters, no dietary restrictions, works ortho [employer],...INTERCOURSE AGE 70, SEXUALPARTNERS LESS THAN 5   Other Topics Concern  . Not on file   Social History Narrative  . No narrative on file    Outpatient Medications Prior to Visit  Medication Sig Dispense Refill  . albuterol (PROVENTIL HFA;VENTOLIN HFA) 108 (90 Base) MCG/ACT inhaler Inhale 2 puffs into the lungs every 6 (six) hours as needed for wheezing or shortness of breath. 1 Inhaler 3  . buPROPion (WELLBUTRIN XL) 150 MG 24 hr tablet Take 1 tablet (150 mg total) by mouth daily. 90 tablet 1  . fexofenadine (ALLEGRA) 30 MG tablet Take 1 tablet (30 mg total) by mouth as needed.    Marland Kitchen ketoconazole (NIZORAL) 2 % cream Reported on 06/17/2015  3  . methocarbamol (ROBAXIN) 500 MG tablet Take 1 tablet (500 mg total) by mouth every 8 (eight) hours as needed  for muscle spasms. 30 tablet 2  . pantoprazole (PROTONIX) 40 MG tablet Take 40 mg by mouth 2 (two) times daily before a meal.     . triamcinolone cream (KENALOG) 0.1 % Reported on 06/17/2015  3  . meloxicam (MOBIC) 7.5 MG tablet TAKE 1 TABLET(7.5 MG) BY MOUTH DAILY AS NEEDED FOR PAIN 30 tablet 0  . zolpidem (AMBIEN) 10 MG tablet Take 1 tablet (10 mg total) by mouth at bedtime as needed for sleep. 15 tablet 5   No facility-administered medications prior to visit.     Allergies  Allergen Reactions  . Gabapentin Rash    Possible urticaria   . Paxil [Paroxetine]   . Sulfonamide Derivatives     Review of Systems  Constitutional: Negative for fever and malaise/fatigue.  HENT: Positive for congestion and sinus pain.   Eyes: Negative for blurred vision.  Respiratory: Negative  for cough and shortness of breath.   Cardiovascular: Negative for chest pain, palpitations and leg swelling.  Gastrointestinal: Negative for vomiting.  Genitourinary: Negative for dysuria.  Musculoskeletal: Negative for back pain and myalgias.  Skin: Positive for rash.  Neurological: Negative for loss of consciousness and headaches.  Psychiatric/Behavioral: Negative for depression. The patient has insomnia. The patient is not nervous/anxious.        Objective:    Physical Exam  Constitutional: She is oriented to person, place, and time. She appears well-developed and well-nourished. No distress.  HENT:  Head: Normocephalic and atraumatic.  Eyes: Conjunctivae are normal.  Neck: Normal range of motion. No thyromegaly present.  Cardiovascular: Normal rate and regular rhythm.   Pulmonary/Chest: Effort normal and breath sounds normal. She has no wheezes.  Abdominal: Soft. Bowel sounds are normal. She exhibits no distension. There is no tenderness. There is no rebound.  Musculoskeletal: Normal range of motion. She exhibits no edema or deformity.  Neurological: She is alert and oriented to person, place, and time.  Skin: Skin is warm and dry. She is not diaphoretic.  Psychiatric: She has a normal mood and affect.    LMP 09/08/2011  Wt Readings from Last 3 Encounters:  12/15/15 150 lb (68 kg)  10/28/15 148 lb 8 oz (67.4 kg)  06/17/15 146 lb 6 oz (66.4 kg)     Lab Results  Component Value Date   WBC 9.2 04/01/2015   HGB 14.5 04/01/2015   HCT 42.1 04/01/2015   PLT 241 04/01/2015   GLUCOSE 88 10/28/2015   CHOL 159 09/08/2014   TRIG 132.0 09/08/2014   HDL 65.00 09/08/2014   LDLCALC 68 09/08/2014   ALT 25 10/28/2015   AST 16 10/28/2015   NA 144 10/28/2015   K 4.1 10/28/2015   CL 96 10/28/2015   CREATININE 1.08 10/28/2015   BUN 27 (H) 10/28/2015   CO2 31 10/28/2015   TSH 1.07 09/08/2014    Lab Results  Component Value Date   TSH 1.07 09/08/2014   Lab Results   Component Value Date   WBC 9.2 04/01/2015   HGB 14.5 04/01/2015   HCT 42.1 04/01/2015   MCV 92.5 04/01/2015   PLT 241 04/01/2015   Lab Results  Component Value Date   NA 144 10/28/2015   K 4.1 10/28/2015   CO2 31 10/28/2015   GLUCOSE 88 10/28/2015   BUN 27 (H) 10/28/2015   CREATININE 1.08 10/28/2015   BILITOT 0.5 10/28/2015   ALKPHOS 43 10/28/2015   AST 16 10/28/2015   ALT 25 10/28/2015   PROT 6.8 10/28/2015   ALBUMIN  4.3 10/28/2015   CALCIUM 9.5 10/28/2015   GFR 56.29 (L) 10/28/2015   Lab Results  Component Value Date   CHOL 159 09/08/2014   Lab Results  Component Value Date   HDL 65.00 09/08/2014   Lab Results  Component Value Date   LDLCALC 68 09/08/2014   Lab Results  Component Value Date   TRIG 132.0 09/08/2014   Lab Results  Component Value Date   CHOLHDL 2 09/08/2014   No results found for: HGBA1C    I acted as a Neurosurgeonscribe for Dr. Abner GreenspanBlyth. [name], RMA  Assessment & Plan:   Problem List Items Addressed This Visit    Chronic cough    [facility], nodules have resolved but cough has returned. They tried several meds but she did not tolerate them. Had hives with Gabapentin, Nexium. Went back to protonix and stopped Gabpentin and symptoms improved.      Insomnia    Encouraged good sleep hygiene such as dark, quiet room. No blue/green glowing lights such as computer screens in bedroom. No alcohol or stimulants in evening. Cut down on caffeine as able. Regular exercise is helpful but not just prior to bed time. Ambien  Works well uses roughly 3 x monthly      Esophageal reflux    Avoid offending foods, start probiotics. Do not eat large meals in late evening and consider raising head of bed. Taking protonix with good results      Constipation    Improved with increased fruits and veg in diet and probiotics      Dermatitis    Follows with dermatology for chronic skin concerns. Flared with recent med      Allergic  state    Using Allegra D with marginal effect. Tried a nasal steroid but no response so stopped it. Start Singulair 10 mg daily       Other Visit Diagnoses    Encounter for preventive measure    -  Primary   Relevant Orders   CBC   Comprehensive metabolic panel   Lipid panel   TSH      I am having Ms. Wherley start on montelukast. I am also having her maintain her ketoconazole, triamcinolone cream, pantoprazole, buPROPion, methocarbamol, albuterol, fexofenadine, zolpidem, and meloxicam.  Meds ordered this encounter  Medications  . zolpidem (AMBIEN) 10 MG tablet    Sig: Take 1 tablet (10 mg total) by mouth at bedtime as needed for sleep.    Dispense:  15 tablet    Refill:  5  . meloxicam (MOBIC) 7.5 MG tablet    Sig: TAKE 1 TABLET(7.5 MG) BY MOUTH DAILY AS NEEDED FOR PAIN    Dispense:  30 tablet    Refill:  0  . montelukast (SINGULAIR) 10 MG tablet    Sig: Take 1 tablet (10 mg total) by mouth at bedtime.    Dispense:  30 tablet    Refill:  3    CMA served as scribe during this visit. History, Physical and Plan performed by medical provider. Documentation and orders reviewed and attested to.  Danise EdgeBLYTH, Destini Cambre, MD

## 2016-05-02 NOTE — Assessment & Plan Note (Addendum)
[facility] have resolved but cough has returned. They tried several meds but she did not tolerate them. Had hives with Gabapentin, Nexium. Went back to protonix and stopped Gabpentin and symptoms improved.

## 2016-05-02 NOTE — Assessment & Plan Note (Signed)
Improved with increased fruits and veg in diet and probiotics

## 2016-05-02 NOTE — Assessment & Plan Note (Signed)
Follows with dermatology for chronic skin concerns. Flared with recent med

## 2016-05-02 NOTE — Assessment & Plan Note (Signed)
Will return in 6 months with labs prior to visit for review

## 2016-05-02 NOTE — Patient Instructions (Signed)

## 2016-05-02 NOTE — Progress Notes (Signed)
Pre visit review using our clinic review tool, if applicable. No additional management support is needed unless otherwise documented below in the visit note. 

## 2016-05-02 NOTE — Assessment & Plan Note (Addendum)
Avoid offending foods, start probiotics. Do not eat large meals in late evening and consider raising head of bed. Taking protonix with good results

## 2016-05-02 NOTE — Assessment & Plan Note (Signed)
Using Allegra D with marginal effect. Tried a nasal steroid but no response so stopped it. Start Singulair 10 mg daily

## 2016-05-02 NOTE — Assessment & Plan Note (Signed)
Encouraged good sleep hygiene such as dark, quiet room. No blue/green glowing lights such as computer screens in bedroom. No alcohol or stimulants in evening. Cut down on caffeine as able. Regular exercise is helpful but not just prior to bed time. Ambien  Works well uses roughly 3 x monthly

## 2016-05-09 ENCOUNTER — Other Ambulatory Visit: Payer: Self-pay | Admitting: Family Medicine

## 2016-05-24 ENCOUNTER — Other Ambulatory Visit: Payer: Self-pay | Admitting: Family Medicine

## 2016-06-01 ENCOUNTER — Encounter: Payer: Self-pay | Admitting: Family Medicine

## 2016-06-08 ENCOUNTER — Telehealth: Payer: Self-pay | Admitting: Family Medicine

## 2016-06-08 NOTE — Telephone Encounter (Signed)
Caller name:Stephanie,Eric Relation to pt: spouse  Call back [phone] [facility] fax # [phone]   Reason for call:  Requesting 90 day supply buPROPion (WELLBUTRIN XL) 150 MG 24 hr tablet, montelukast (SINGULAIR) 10 MG tablet , meloxicam (MOBIC) 7.5 MG tablet

## 2016-06-09 MED ORDER — MELOXICAM 7.5 MG PO TABS: ORAL_TABLET | ORAL | 0 refills | Status: DC

## 2016-06-09 MED ORDER — MONTELUKAST SODIUM 10 MG PO TABS: 10.0000 mg | ORAL_TABLET | Freq: Every day | ORAL | 3 refills | Status: DC

## 2016-06-09 MED ORDER — BUPROPION HCL ER (XL) 150 MG PO TB24: 150.0000 mg | ORAL_TABLET | Freq: Every day | ORAL | 0 refills | Status: DC

## 2016-06-09 NOTE — Telephone Encounter (Signed)
Refills done.

## 2016-08-18 ENCOUNTER — Encounter: Payer: Self-pay | Admitting: Women's Health

## 2016-08-23 ENCOUNTER — Other Ambulatory Visit: Payer: Self-pay | Admitting: Family Medicine

## 2016-08-23 NOTE — Telephone Encounter (Signed)
Received a refill request for Wellbutrin XL 150mg  (take 1 tab po qd) #90, 0RF  Last RF: 06/12/2016 Last OV: 05/02/2016 Next OV: 11/14/2016 UDS: None  Forwarded to Provider for review, approval or denial.

## 2016-08-30 ENCOUNTER — Encounter: Payer: Self-pay | Admitting: Gynecology

## 2016-10-04 ENCOUNTER — Telehealth: Payer: Self-pay | Admitting: *Deleted

## 2016-10-04 NOTE — Telephone Encounter (Signed)
Ok to refill Ambien #15 no refills

## 2016-10-04 NOTE — Telephone Encounter (Signed)
[facility] mg tab Last filled by MD on 05/02/16, #15x5 Last AEX - 05/02/16 Next AEX - 6-Mths. Please Advise on refills/SLS 06/20

## 2016-10-05 MED ORDER — ZOLPIDEM TARTRATE 10 MG PO TABS: 10.0000 mg | ORAL_TABLET | Freq: Every evening | ORAL | 0 refills | Status: DC | PRN

## 2016-10-05 NOTE — Telephone Encounter (Signed)
Faxed hardcopy for zolpidem to [facility] [place]

## 2016-10-25 ENCOUNTER — Other Ambulatory Visit (INDEPENDENT_AMBULATORY_CARE_PROVIDER_SITE_OTHER): Payer: 59

## 2016-10-25 DIAGNOSIS — Z299 Encounter for prophylactic measures, unspecified: Secondary | ICD-10-CM | POA: Diagnosis not present

## 2016-10-25 LAB — LIPID PANEL
Cholesterol: 190 mg/dL (ref 0–200)
HDL: 78.5 mg/dL (ref >39.00)
LDL CALC: 95 mg/dL (ref 0–99)
NONHDL: 111.72
Total CHOL/HDL Ratio: 2
Triglycerides: 86 mg/dL (ref 0.0–149.0)
VLDL: 17.2 mg/dL (ref 0.0–40.0)

## 2016-10-25 LAB — CBC
HCT: 41.4 % (ref 36.0–46.0)
Hemoglobin: 14.1 g/dL (ref 12.0–15.0)
MCHC: 34 g/dL (ref 30.0–36.0)
MCV: 93 fl (ref 78.0–100.0)
Platelets: 258 10*3/uL (ref 150.0–400.0)
RBC: 4.46 Mil/uL (ref 3.87–5.11)
RDW: 12.2 % (ref 11.5–15.5)
WBC: 7.5 10*3/uL (ref 4.0–10.5)

## 2016-10-25 LAB — COMPREHENSIVE METABOLIC PANEL
ALBUMIN: 4.4 g/dL (ref 3.5–5.2)
ALK PHOS: 43 U/L (ref 39–117)
ALT: 15 U/L (ref 0–35)
AST: 16 U/L (ref 0–37)
BUN: 25 mg/dL — AB (ref 6–23)
CHLORIDE: 103 meq/L (ref 96–112)
CO2: 28 mEq/L (ref 19–32)
CREATININE: 1.05 mg/dL (ref 0.40–1.20)
Calcium: 9.6 mg/dL (ref 8.4–10.5)
GFR: 57.93 mL/min — ABNORMAL LOW (ref >60.00)
GLUCOSE: 93 mg/dL (ref 70–99)
Potassium: 3.8 mEq/L (ref 3.5–5.1)
SODIUM: 139 meq/L (ref 135–145)
TOTAL PROTEIN: 6.9 g/dL (ref 6.0–8.3)
Total Bilirubin: 0.6 mg/dL (ref 0.2–1.2)

## 2016-10-25 LAB — TSH: TSH: 2.14 u[IU]/mL (ref 0.35–4.50)

## 2016-10-30 ENCOUNTER — Encounter: Payer: Managed Care, Other (non HMO) | Admitting: Family Medicine

## 2016-11-14 ENCOUNTER — Encounter: Payer: Self-pay | Admitting: Family Medicine

## 2016-11-14 ENCOUNTER — Ambulatory Visit (INDEPENDENT_AMBULATORY_CARE_PROVIDER_SITE_OTHER): Payer: 59 | Admitting: Family Medicine

## 2016-11-14 VITALS — BP 132/86 | HR 72 | Temp 98.4°F | Ht 64.0 in | Wt 152.4 lb

## 2016-11-14 DIAGNOSIS — R05 Cough: Secondary | ICD-10-CM

## 2016-11-14 DIAGNOSIS — Z0001 Encounter for general adult medical examination with abnormal findings: Secondary | ICD-10-CM | POA: Diagnosis not present

## 2016-11-14 DIAGNOSIS — K219 Gastro-esophageal reflux disease without esophagitis: Secondary | ICD-10-CM

## 2016-11-14 DIAGNOSIS — M545 Low back pain: Secondary | ICD-10-CM

## 2016-11-14 DIAGNOSIS — R053 Chronic cough: Secondary | ICD-10-CM

## 2016-11-14 DIAGNOSIS — G4700 Insomnia, unspecified: Secondary | ICD-10-CM

## 2016-11-14 DIAGNOSIS — Z0000 Encounter for general adult medical examination without abnormal findings: Secondary | ICD-10-CM

## 2016-11-14 MED ORDER — ZOLPIDEM TARTRATE 10 MG PO TABS: 10.0000 mg | ORAL_TABLET | Freq: Every evening | ORAL | 5 refills | Status: DC | PRN

## 2016-11-14 MED ORDER — MELOXICAM 7.5 MG PO TABS: 7.5000 mg | ORAL_TABLET | Freq: Every day | ORAL | 3 refills | Status: DC | PRN

## 2016-11-14 MED ORDER — METHOCARBAMOL 500 MG PO TABS: 500.0000 mg | ORAL_TABLET | Freq: Two times a day (BID) | ORAL | 3 refills | Status: AC | PRN

## 2016-11-14 MED ORDER — BUPROPION HCL ER (XL) 150 MG PO TB24: 150.0000 mg | ORAL_TABLET | Freq: Every day | ORAL | 3 refills | Status: AC

## 2016-11-14 NOTE — Patient Instructions (Addendum)
Elderberry liquid as needed for cough  Shingrix is the new shingles shot is 2 shots over 6 months   Preventive Care 40-64 Years, Female Preventive care refers to lifestyle choices and visits with your health care provider that can promote health and wellness. What does preventive care include?  A yearly physical exam. This is also called an annual well check.  Dental exams once or twice a year.  Routine eye exams. Ask your health care provider how often you should have your eyes checked.  Personal lifestyle choices, including: ? Daily care of your teeth and gums. ? Regular physical activity. ? Eating a healthy diet. ? Avoiding tobacco and drug use. ? Limiting alcohol use. ? Practicing safe sex. ? Taking low-dose aspirin daily starting at age 86. ? Taking vitamin and mineral supplements as recommended by your health care provider. What happens during an annual well check? The services and screenings done by your health care provider during your annual well check will depend on your age, overall health, lifestyle risk factors, and family history of disease. Counseling Your health care provider may ask you questions about your:  Alcohol use.  Tobacco use.  Drug use.  Emotional well-being.  Home and relationship well-being.  Sexual activity.  Eating habits.  Work and work Statistician.  Method of birth control.  Menstrual cycle.  Pregnancy history.  Screening You may have the following tests or measurements:  Height, weight, and BMI.  Blood pressure.  Lipid and cholesterol levels. These may be checked every 5 years, or more frequently if you are over 27 years old.  Skin check.  Lung cancer screening. You may have this screening every year starting at age 65 if you have a 30-pack-year history of smoking and currently smoke or have quit within the past 15 years.  Fecal occult blood test (FOBT) of the stool. You may have this test every year starting at age  27.  Flexible sigmoidoscopy or colonoscopy. You may have a sigmoidoscopy every 5 years or a colonoscopy every 10 years starting at age 55.  Hepatitis C blood test.  Hepatitis B blood test.  Sexually transmitted disease (STD) testing.  Diabetes screening. This is done by checking your blood sugar (glucose) after you have not eaten for a while (fasting). You may have this done every 1-3 years.  Mammogram. This may be done every 1-2 years. Talk to your health care provider about when you should start having regular mammograms. This may depend on whether you have a family history of breast cancer.  BRCA-related cancer screening. This may be done if you have a family history of breast, ovarian, tubal, or peritoneal cancers.  Pelvic exam and Pap test. This may be done every 3 years starting at age 39. Starting at age 66, this may be done every 5 years if you have a Pap test in combination with an HPV test.  Bone density scan. This is done to screen for osteoporosis. You may have this scan if you are at high risk for osteoporosis.  Discuss your test results, treatment options, and if necessary, the need for more tests with your health care provider. Vaccines Your health care provider may recommend certain vaccines, such as:  Influenza vaccine. This is recommended every year.  Tetanus, diphtheria, and acellular pertussis (Tdap, Td) vaccine. You may need a Td booster every 10 years.  Varicella vaccine. You may need this if you have not been vaccinated.  Zoster vaccine. You may need this after age 64.  Measles, mumps, and rubella (MMR) vaccine. You may need at least one dose of MMR if you were born in 1957 or later. You may also need a second dose.  Pneumococcal 13-valent conjugate (PCV13) vaccine. You may need this if you have certain conditions and were not previously vaccinated.  Pneumococcal polysaccharide (PPSV23) vaccine. You may need one or two doses if you smoke cigarettes or if you  have certain conditions.  Meningococcal vaccine. You may need this if you have certain conditions.  Hepatitis A vaccine. You may need this if you have certain conditions or if you travel or work in places where you may be exposed to hepatitis A.  Hepatitis B vaccine. You may need this if you have certain conditions or if you travel or work in places where you may be exposed to hepatitis B.  Haemophilus influenzae type b (Hib) vaccine. You may need this if you have certain conditions.  Talk to your health care provider about which screenings and vaccines you need and how often you need them. This information is not intended to replace advice given to you by your health care provider. Make sure you discuss any questions you have with your health care provider. Document Released: 04/30/2015 Document Revised: 12/22/2015 Document Reviewed: 02/02/2015 Elsevier Interactive Patient Education  2017 [employer].

## 2016-11-14 NOTE — Assessment & Plan Note (Signed)
Continues to follow with

## 2016-11-14 NOTE — Progress Notes (Signed)
Subjective:  I acted as a Neurosurgeonscribe for Dr. Abner GreenspanBlyth. [name], [place]MA  Patient ID: Patricia SniffCarol M Roy, female    DOB: Jul 20, 1960, 56 y.o.   MRN: [id number]  No chief complaint on file.   HPI  Patient is in today for an annual exam. She is feeling well today but has had some trouble with back and neck pain. She has been following with orthopedics at dr. Cathren Lainerumfield for her neck pain and left upper extremity radiculopathy. presently adequately controlled. her chronic cough has been treated with pantoprazole with improvement. she has seen ent at [facility] dr. Delford Fieldwright for this. she quit smoking 12-1/2 years ago. she does struggle with allergies but allegra has been helpful. singulair has also been helpful. Denies CP/palp/SOB/HA/congestion/fevers/GI or GU c/o. Taking meds as prescribed.   Patient Care Team: Bradd CanaryBlyth, Halcyon Heck A, MD as PCP - General (Family Medicine) Harrington ChallengerYoung, Nancy J, NP as Nurse Practitioner (Obstetrics and Gynecology) Iva BoopGessner, Carl E, MD as Consulting Physician (Gastroenterology) Barnie AldermanWright, Stephen Carter, MD as Referring Physician (Otolaryngology)   Past Medical History:  Diagnosis Date  . Allergic state 05/02/2016  . Back pain 06/27/2015  . Blood stool    Hemorrhoids  . Chronic cough 09/08/2014  . Constipation 06/27/2015  . DDD (degenerative disc disease), cervical   . Dermatitis 10/28/2015  . Esophageal reflux 09/19/2014  . Hx: UTI (urinary tract infection) 09/08/2014  . Insomnia 09/19/2014  . Internal hemorrhoid 09/08/2014  . Multiple thyroid nodules 09/19/2014   and vocal cord nodules  . Preventative health care 10/28/2015  . Rectal bleeding 09/08/2014  . 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 Urine incontinence     Past Surgical History:  Procedure Laterality Date  . child birth  1992, 2000 and 2004   stitches each time  . TONSILLECTOMY      Family History  Problem Relation Age of Onset  . Heart disease Mother        H/O valve replacement at 8786  . Stroke Mother   . Pulmonary  embolism Mother   . Pancreatic cancer Father   . Alcohol abuse Father   . Alzheimer's disease Maternal Aunt   . Other Sister        chronic lyme  . Pulmonary embolism Brother        DVTs  . Alcohol abuse Brother        recovering  . Migraines Daughter   . Alcohol abuse Brother        recovering  . Alcohol abuse Sister        dementia  . Dementia Sister     Social History   Social History  . Marital status: Married    Spouse name: N/A  . Number of children: 3  . Years of education: 3316   Occupational History  . Utilization Review for [employer]   Social History Main Topics  . Smoking status: Former Smoker    Packs/day: 0.50    Types: Cigarettes    Quit date: 04/19/2015  . Smokeless tobacco: Never Used  . Alcohol use 0.0 oz/week     Comment: Occasional  . Drug use: No  . Sexual activity: Yes     Comment: lives with husband (heart diseaseand duaghters, no dietary restrictions, works ortho [employer],...INTERCOURSE AGE 44, SEXUALPARTNERS LESS THAN 5   Other Topics Concern  . Not on file   Social History Narrative  . No narrative on file    Outpatient Medications Prior to Visit  Medication Sig Dispense  Refill  . albuterol (PROVENTIL HFA;VENTOLIN HFA) 108 (90 Base) MCG/ACT inhaler Inhale 2 puffs into the lungs every 6 (six) hours as needed for wheezing or shortness of breath. 1 Inhaler 3  . fexofenadine (ALLEGRA) 30 MG tablet Take 1 tablet (30 mg total) by mouth as needed.    Marland Kitchen ketoconazole (NIZORAL) 2 % cream Reported on 06/17/2015  3  . montelukast (SINGULAIR) 10 MG tablet Take 1 tablet (10 mg total) by mouth at bedtime. 90 tablet 3  . pantoprazole (PROTONIX) 40 MG tablet Take 40 mg by mouth 2 (two) times daily before a meal.     . triamcinolone cream (KENALOG) 0.1 % Reported on 06/17/2015  3  . buPROPion (WELLBUTRIN XL) 150 MG 24 hr tablet TAKE 1 TABLET DAILY 90 tablet 0  . meloxicam (MOBIC) 7.5 MG tablet TAKE 1 TABLET(7.5 MG) BY MOUTH DAILY AS NEEDED FOR PAIN 30  tablet 0  . meloxicam (MOBIC) 7.5 MG tablet TAKE 1 TABLET(7.5 MG) BY MOUTH DAILY AS NEEDED FOR PAIN 30 tablet 0  . methocarbamol (ROBAXIN) 500 MG tablet Take 1 tablet (500 mg total) by mouth every 8 (eight) hours as needed for muscle spasms. 30 tablet 2  . zolpidem (AMBIEN) 10 MG tablet Take 1 tablet (10 mg total) by mouth at bedtime as needed for sleep. 15 tablet 0   No facility-administered medications prior to visit.     Allergies  Allergen Reactions  . Gabapentin Rash    Possible urticaria   . Paxil [Paroxetine]   . Sulfonamide Derivatives     Review of Systems  Constitutional: Negative for fever and malaise/fatigue.  HENT: Negative for congestion.   Eyes: Negative for blurred vision.  Respiratory: Negative for cough and shortness of breath.   Cardiovascular: Negative for chest pain, palpitations and leg swelling.  Gastrointestinal: Negative for vomiting.  Musculoskeletal: Positive for back pain and neck pain.  Skin: Negative for rash.  Neurological: Positive for tingling. Negative for loss of consciousness and headaches.  Psychiatric/Behavioral: The patient has insomnia.        Objective:    Physical Exam  Constitutional: She is oriented to person, place, and time. She appears well-developed and well-nourished. No distress.  HENT:  Head: Normocephalic and atraumatic.  Eyes: Conjunctivae are normal.  Neck: Normal range of motion. No thyromegaly present.  Cardiovascular: Normal rate and regular rhythm.   Pulmonary/Chest: Effort normal and breath sounds normal. She has no wheezes.  Abdominal: Soft. Bowel sounds are normal. There is no tenderness.  Musculoskeletal: Normal range of motion. She exhibits no edema or deformity.  Neurological: She is alert and oriented to person, place, and time.  Skin: Skin is warm and dry. She is not diaphoretic.  Psychiatric: She has a normal mood and affect.    BP 132/86 (BP Location: Left Arm, Patient Position: Sitting, Cuff Size:  Normal)   Pulse 72   Temp 98.4 F (36.9 C) (Oral)   Ht 5\' 4"  (1.626 m)   Wt 152 lb 6.4 oz (69.1 kg)   LMP 09/08/2011   SpO2 98%   BMI 26.16 kg/m  Wt Readings from Last 3 Encounters:  11/14/16 152 lb 6.4 oz (69.1 kg)  12/15/15 150 lb (68 kg)  10/28/15 148 lb 8 oz (67.4 kg)   BP Readings from Last 3 Encounters:  11/14/16 132/86  12/15/15 122/80  10/28/15 108/72     Immunization History  Administered Date(s) Administered  . Influenza Whole 04/17/2000  . Influenza-Unspecified 03/17/2014, 01/27/2015, 01/31/2016  . Td  03/17/2014    Health Maintenance  Topic Date Due  . Hepatitis C Screening  Aug 11, 1960  . HIV Screening  04/09/1977  . INFLUENZA VACCINE  11/15/2016  . PAP SMEAR  11/10/2017  . MAMMOGRAM  08/19/2018  . TETANUS/TDAP  03/17/2024  . COLONOSCOPY  12/01/2024    Lab Results  Component Value Date   WBC 7.5 10/25/2016   HGB 14.1 10/25/2016   HCT 41.4 10/25/2016   PLT 258.0 10/25/2016   GLUCOSE 93 10/25/2016   CHOL 190 10/25/2016   TRIG 86.0 10/25/2016   HDL 78.50 10/25/2016   LDLCALC 95 10/25/2016   ALT 15 10/25/2016   AST 16 10/25/2016   NA 139 10/25/2016   K 3.8 10/25/2016   CL 103 10/25/2016   CREATININE 1.05 10/25/2016   BUN 25 (H) 10/25/2016   CO2 28 10/25/2016   TSH 2.14 10/25/2016    Lab Results  Component Value Date   TSH 2.14 10/25/2016   Lab Results  Component Value Date   WBC 7.5 10/25/2016   HGB 14.1 10/25/2016   HCT 41.4 10/25/2016   MCV 93.0 10/25/2016   PLT 258.0 10/25/2016   Lab Results  Component Value Date   NA 139 10/25/2016   K 3.8 10/25/2016   CO2 28 10/25/2016   GLUCOSE 93 10/25/2016   BUN 25 (H) 10/25/2016   CREATININE 1.05 10/25/2016   BILITOT 0.6 10/25/2016   ALKPHOS 43 10/25/2016   AST 16 10/25/2016   ALT 15 10/25/2016   PROT 6.9 10/25/2016   ALBUMIN 4.4 10/25/2016   CALCIUM 9.6 10/25/2016   GFR 57.93 (L) 10/25/2016   Lab Results  Component Value Date   CHOL 190 10/25/2016   Lab Results  Component  Value Date   HDL 78.50 10/25/2016   Lab Results  Component Value Date   LDLCALC 95 10/25/2016   Lab Results  Component Value Date   TRIG 86.0 10/25/2016   Lab Results  Component Value Date   CHOLHDL 2 10/25/2016   No results found for: HGBA1C       Assessment & Plan:   Problem List Items Addressed This Visit    Chronic cough    Continues to follow with       Insomnia    Encouraged good sleep hygiene such as dark, quiet room. No blue/green glowing lights such as computer screens in bedroom. No alcohol or stimulants in evening. Cut down on caffeine as able. Regular exercise is helpful but not just prior to bed time. May continue Zolpidem prn      Esophageal reflux    Avoid offending foods, start probiotics. Do not eat large meals in late evening and consider raising head of bed.       Back pain    Encouraged moist heat and gentle stretching as tolerated. May try NSAIDs and prescription meds as directed and report if symptoms worsen or seek immediate care. Robaxin Rx given today to use prn      Relevant Medications   meloxicam (MOBIC) 7.5 MG tablet   methocarbamol (ROBAXIN) 500 MG tablet   Preventative health care    Patient encouraged to maintain heart healthy diet, regular exercise, adequate sleep. Consider daily probiotics. Take medications as prescribed         I have discontinued Ms. Otten's meloxicam. I have also changed her meloxicam, methocarbamol, and buPROPion. Additionally, I am having her maintain her ketoconazole, triamcinolone cream, pantoprazole, albuterol, fexofenadine, montelukast, and zolpidem.  Meds ordered this encounter  Medications  .  zolpidem (AMBIEN) 10 MG tablet    Sig: Take 1 tablet (10 mg total) by mouth at bedtime as needed for sleep.    Dispense:  15 tablet    Refill:  5  . meloxicam (MOBIC) 7.5 MG tablet    Sig: Take 1-2 tablets (7.5-15 mg total) by mouth daily as needed for pain.    Dispense:  60 tablet    Refill:  3  .  methocarbamol (ROBAXIN) 500 MG tablet    Sig: Take 1 tablet (500 mg total) by mouth 2 (two) times daily as needed for muscle spasms.    Dispense:  45 tablet    Refill:  3  . buPROPion (WELLBUTRIN XL) 150 MG 24 hr tablet    Sig: Take 1 tablet (150 mg total) by mouth daily.    Dispense:  90 tablet    Refill:  3    CMA served as scribe during this visit. History, Physical and Plan performed by medical provider. Documentation and orders reviewed and attested to.  Danise Edge, MD

## 2016-11-19 NOTE — Assessment & Plan Note (Signed)
Avoid offending foods, start probiotics. Do not eat large meals in late evening and consider raising head of bed.  

## 2016-11-19 NOTE — Assessment & Plan Note (Signed)
Encouraged moist heat and gentle stretching as tolerated. May try NSAIDs and prescription meds as directed and report if symptoms worsen or seek immediate care. Robaxin Rx given today to use prn

## 2016-11-19 NOTE — Assessment & Plan Note (Signed)
Patient encouraged to maintain heart healthy diet, regular exercise, adequate sleep. Consider daily probiotics. Take medications as prescribed 

## 2016-11-19 NOTE — Assessment & Plan Note (Signed)
Encouraged good sleep hygiene such as dark, quiet room. No blue/green glowing lights such as computer screens in bedroom. No alcohol or stimulants in evening. Cut down on caffeine as able. Regular exercise is helpful but not just prior to bed time. May continue Zolpidem prn 

## 2016-12-15 ENCOUNTER — Encounter: Payer: Managed Care, Other (non HMO) | Admitting: Women's Health

## 2016-12-20 ENCOUNTER — Ambulatory Visit (INDEPENDENT_AMBULATORY_CARE_PROVIDER_SITE_OTHER): Payer: 59 | Admitting: Women's Health

## 2016-12-20 ENCOUNTER — Encounter: Payer: Self-pay | Admitting: Women's Health

## 2016-12-20 VITALS — BP 124/78 | Ht 64.0 in | Wt 152.0 lb

## 2016-12-20 DIAGNOSIS — N898 Other specified noninflammatory disorders of vagina: Secondary | ICD-10-CM | POA: Diagnosis not present

## 2016-12-20 DIAGNOSIS — Z01419 Encounter for gynecological examination (general) (routine) without abnormal findings: Secondary | ICD-10-CM | POA: Diagnosis not present

## 2016-12-20 LAB — WET PREP FOR TRICH, YEAST, CLUE

## 2016-12-20 NOTE — Progress Notes (Signed)
Patricia SniffCarol M Roy Jul 30, 1960 [id number]    History:    Presents for annual exam.  Postmenopausal on no HRT with no bleeding. Normal Pap and mammogram history. Reports normal DEXA at work. History of thyroid nodules with negative ultrasounds. 2016 negative colonoscopy.  Past medical history, past surgical history, family history and social history were all reviewed and documented in the EPIC chart. 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 3 daughters ages 6926-13 all doing well.  ROS:  A ROS was performed and pertinent positives and negatives are included.  Exam:  Vitals:   12/20/16 1121  BP: 124/78  Weight: 152 lb (68.9 kg)  Height: 5\' 4"  (1.626 m)   Body mass index is 26.09 kg/m.   General appearance:  Normal Thyroid:  Symmetrical, normal in size, without palpable masses or nodularity. Respiratory  Auscultation:  Clear without wheezing or rhonchi Cardiovascular  Auscultation:  Regular rate, without rubs, murmurs or gallops  Edema/varicosities:  Not grossly evident Abdominal  Soft,nontender, without masses, guarding or rebound.  Liver/spleen:  No organomegaly noted  Hernia:  None appreciated  Skin  Inspection:  Grossly normal   Breasts: Examined lying and sitting.     Right: Without masses, retractions, discharge or axillary adenopathy.     Left: Without masses, retractions, discharge or axillary adenopathy. Gentitourinary   Inguinal/mons:  Normal without inguinal adenopathy  External genitalia:  Normal  BUS/Urethra/Skene's glands:  Normal  Vagina:  Mild atrophy, wet prep negative  Cervix:  Normal  Uterus:   normal in size, shape and contour.  Midline and mobile  Adnexa/parametria:     Rt: Without masses or tenderness.   Lt: Without masses or tenderness.  Anus and perineum: Normal  Digital rectal exam: Normal sphincter tone without palpated masses or tenderness  Assessment/Plan:  56 y.o. MWF G3 P3 for annual exam with complaint of  external vaginal irritation without itching or odor.  Postmenopausal/no HRT/no bleeding Primary care manages asthma, anxiety/depression labs and meds Degenerative disc disease/neck and low back pain  Plan: Reviewed normality of exam and wet prep, encouraged loose clothing, open to air as able. SBE's, continue annual screening mammogram, calcium rich diet, vitamin D 2000 daily encouraged. Exercise as able, yoga encouraged. Pap normal 2016, new screening guidelines reviewed.    Harrington Challengerancy J Young Firstlight Health SystemWHNP, 12:13 PM 12/20/2016

## 2016-12-20 NOTE — Patient Instructions (Signed)
Health Maintenance for Postmenopausal Women Menopause is a normal process in which your reproductive ability comes to an end. This process happens gradually over a span of months to years, usually between the ages of 22 and 9. Menopause is complete when you have missed 12 consecutive menstrual periods. It is important to talk with your health care provider about some of the most common conditions that affect postmenopausal women, such as heart disease, cancer, and bone loss (osteoporosis). Adopting a healthy lifestyle and getting preventive care can help to promote your health and wellness. Those actions can also lower your chances of developing some of these common conditions. What should I know about menopause? During menopause, you may experience a number of symptoms, such as:  Moderate-to-severe hot flashes.  Night sweats.  Decrease in sex drive.  Mood swings.  Headaches.  Tiredness.  Irritability.  Memory problems.  Insomnia.  Choosing to treat or not to treat menopausal changes is an individual decision that you make with your health care provider. What should I know about hormone replacement therapy and supplements? Hormone therapy products are effective for treating symptoms that are associated with menopause, such as hot flashes and night sweats. Hormone replacement carries certain risks, especially as you become older. If you are thinking about using estrogen or estrogen with progestin treatments, discuss the benefits and risks with your health care provider. What should I know about heart disease and stroke? Heart disease, heart attack, and stroke become more likely as you age. This may be due, in part, to the hormonal changes that your body experiences during menopause. These can affect how your body processes dietary fats, triglycerides, and cholesterol. Heart attack and stroke are both medical emergencies. There are many things that you can do to help prevent heart disease  and stroke:  Have your blood pressure checked at least every 1-2 years. High blood pressure causes heart disease and increases the risk of stroke.  If you are 53-22 years old, ask your health care provider if you should take aspirin to prevent a heart attack or a stroke.  Do not use any tobacco products, including cigarettes, chewing tobacco, or electronic cigarettes. If you need help quitting, ask your health care provider.  It is important to eat a healthy diet and maintain a healthy weight. ? Be sure to include plenty of vegetables, fruits, low-fat dairy products, and lean protein. ? Avoid eating foods that are high in solid fats, added sugars, or salt (sodium).  Get regular exercise. This is one of the most important things that you can do for your health. ? Try to exercise for at least 150 minutes each week. The type of exercise that you do should increase your heart rate and make you sweat. This is known as moderate-intensity exercise. ? Try to do strengthening exercises at least twice each week. Do these in addition to the moderate-intensity exercise.  Know your numbers.Ask your health care provider to check your cholesterol and your blood glucose. Continue to have your blood tested as directed by your health care provider.  What should I know about cancer screening? There are several types of cancer. Take the following steps to reduce your risk and to catch any cancer development as early as possible. Breast Cancer  Practice breast self-awareness. ? This means understanding how your breasts normally appear and feel. ? It also means doing regular breast self-exams. Let your health care provider know about any changes, no matter how small.  If you are 40  or older, have a clinician do a breast exam (clinical breast exam or CBE) every year. Depending on your age, family history, and medical history, it may be recommended that you also have a yearly breast X-ray (mammogram).  If you  have a family history of breast cancer, talk with your health care provider about genetic screening.  If you are at high risk for breast cancer, talk with your health care provider about having an MRI and a mammogram every year.  Breast cancer (BRCA) gene test is recommended for women who have family members with BRCA-related cancers. Results of the assessment will determine the need for genetic counseling and BRCA1 and for BRCA2 testing. BRCA-related cancers include these types: ? Breast. This occurs in males or females. ? Ovarian. ? Tubal. This may also be called fallopian tube cancer. ? Cancer of the abdominal or pelvic lining (peritoneal cancer). ? Prostate. ? Pancreatic.  Cervical, Uterine, and Ovarian Cancer Your health care provider may recommend that you be screened regularly for cancer of the pelvic organs. These include your ovaries, uterus, and vagina. This screening involves a pelvic exam, which includes checking for microscopic changes to the surface of your cervix (Pap test).  For women ages 21-65, health care providers may recommend a pelvic exam and a Pap test every three years. For women ages 79-65, they may recommend the Pap test and pelvic exam, combined with testing for human papilloma virus (HPV), every five years. Some types of HPV increase your risk of cervical cancer. Testing for HPV may also be done on women of any age who have unclear Pap test results.  Other health care providers may not recommend any screening for nonpregnant women who are considered low risk for pelvic cancer and have no symptoms. Ask your health care provider if a screening pelvic exam is right for you.  If you have had past treatment for cervical cancer or a condition that could lead to cancer, you need Pap tests and screening for cancer for at least 20 years after your treatment. If Pap tests have been discontinued for you, your risk factors (such as having a new sexual partner) need to be  reassessed to determine if you should start having screenings again. Some women have medical problems that increase the chance of getting cervical cancer. In these cases, your health care provider may recommend that you have screening and Pap tests more often.  If you have a family history of uterine cancer or ovarian cancer, talk with your health care provider about genetic screening.  If you have vaginal bleeding after reaching menopause, tell your health care provider.  There are currently no reliable tests available to screen for ovarian cancer.  Lung Cancer Lung cancer screening is recommended for adults 69-62 years old who are at high risk for lung cancer because of a history of smoking. A yearly low-dose CT scan of the lungs is recommended if you:  Currently smoke.  Have a history of at least 30 pack-years of smoking and you currently smoke or have quit within the past 15 years. A pack-year is smoking an average of one pack of cigarettes per day for one year.  Yearly screening should:  Continue until it has been 15 years since you quit.  Stop if you develop a health problem that would prevent you from having lung cancer treatment.  Colorectal Cancer  This type of cancer can be detected and can often be prevented.  Routine colorectal cancer screening usually begins at  age 42 and continues through age 45.  If you have risk factors for colon cancer, your health care provider may recommend that you be screened at an earlier age.  If you have a family history of colorectal cancer, talk with your health care provider about genetic screening.  Your health care provider may also recommend using home test kits to check for hidden blood in your stool.  A small camera at the end of a tube can be used to examine your colon directly (sigmoidoscopy or colonoscopy). This is done to check for the earliest forms of colorectal cancer.  Direct examination of the colon should be repeated every  5-10 years until age 71. However, if early forms of precancerous polyps or small growths are found or if you have a family history or genetic risk for colorectal cancer, you may need to be screened more often.  Skin Cancer  Check your skin from head to toe regularly.  Monitor any moles. Be sure to tell your health care provider: ? About any new moles or changes in moles, especially if there is a change in a mole's shape or color. ? If you have a mole that is larger than the size of a pencil eraser.  If any of your family members has a history of skin cancer, especially at a young age, talk with your health care provider about genetic screening.  Always use sunscreen. Apply sunscreen liberally and repeatedly throughout the day.  Whenever you are outside, protect yourself by wearing long sleeves, pants, a wide-brimmed hat, and sunglasses.  What should I know about osteoporosis? Osteoporosis is a condition in which bone destruction happens more quickly than new bone creation. After menopause, you may be at an increased risk for osteoporosis. To help prevent osteoporosis or the bone fractures that can happen because of osteoporosis, the following is recommended:  If you are 46-71 years old, get at least 1,000 mg of calcium and at least 600 mg of vitamin D per day.  If you are older than age 55 but younger than age 65, get at least 1,200 mg of calcium and at least 600 mg of vitamin D per day.  If you are older than age 54, get at least 1,200 mg of calcium and at least 800 mg of vitamin D per day.  Smoking and excessive alcohol intake increase the risk of osteoporosis. Eat foods that are rich in calcium and vitamin D, and do weight-bearing exercises several times each week as directed by your health care provider. What should I know about how menopause affects my mental health? Depression may occur at any age, but it is more common as you become older. Common symptoms of depression  include:  Low or sad mood.  Changes in sleep patterns.  Changes in appetite or eating patterns.  Feeling an overall lack of motivation or enjoyment of activities that you previously enjoyed.  Frequent crying spells.  Talk with your health care provider if you think that you are experiencing depression. What should I know about immunizations? It is important that you get and maintain your immunizations. These include:  Tetanus, diphtheria, and pertussis (Tdap) booster vaccine.  Influenza every year before the flu season begins.  Pneumonia vaccine.  Shingles vaccine.  Your health care provider may also recommend other immunizations. This information is not intended to replace advice given to you by your health care provider. Make sure you discuss any questions you have with your health care provider. Document Released: 05/26/2005  Document Revised: 10/22/2015 Document Reviewed: 01/05/2015 [employer] Interactive Patient Education  2018 [employer]  

## 2016-12-22 LAB — URINALYSIS W MICROSCOPIC + REFLEX CULTURE
Bilirubin Urine: NEGATIVE
GLUCOSE, UA: NEGATIVE
HYALINE CAST: NONE SEEN /LPF
Hgb urine dipstick: NEGATIVE
Ketones, ur: NEGATIVE
Nitrites, Initial: NEGATIVE
PH: 7.5 (ref 5.0–8.0)
Protein, ur: NEGATIVE
SPECIFIC GRAVITY, URINE: 1.018 (ref 1.001–1.03)
WBC, UA: >=60 /HPF — AB (ref 0–5)

## 2016-12-22 LAB — URINE CULTURE
MICRO NUMBER: 80978737
SPECIMEN QUALITY: ADEQUATE

## 2016-12-22 LAB — CULTURE INDICATED

## 2017-03-12 ENCOUNTER — Other Ambulatory Visit: Payer: Self-pay | Admitting: Family Medicine

## 2017-03-12 ENCOUNTER — Encounter: Payer: Self-pay | Admitting: Family Medicine

## 2017-03-13 MED ORDER — ALBUTEROL SULFATE HFA 108 (90 BASE) MCG/ACT IN AERS: 2.0000 | INHALATION_SPRAY | Freq: Four times a day (QID) | RESPIRATORY_TRACT | 5 refills | Status: DC | PRN

## 2017-03-31 ENCOUNTER — Other Ambulatory Visit: Payer: Self-pay

## 2017-03-31 ENCOUNTER — Emergency Department (HOSPITAL_BASED_OUTPATIENT_CLINIC_OR_DEPARTMENT_OTHER): Payer: 59

## 2017-03-31 ENCOUNTER — Emergency Department (HOSPITAL_BASED_OUTPATIENT_CLINIC_OR_DEPARTMENT_OTHER)
Admission: EM | Admit: 2017-03-31 | Discharge: 2017-04-01 | Disposition: A | Discharge status: Home / Self Care | Payer: 59 | Attending: Physician Assistant | Admitting: Physician Assistant

## 2017-03-31 ENCOUNTER — Encounter (HOSPITAL_BASED_OUTPATIENT_CLINIC_OR_DEPARTMENT_OTHER): Payer: Self-pay | Admitting: *Deleted

## 2017-03-31 DIAGNOSIS — Z79899 Other long term (current) drug therapy: Secondary | ICD-10-CM | POA: Diagnosis not present

## 2017-03-31 DIAGNOSIS — R05 Cough: Secondary | ICD-10-CM

## 2017-03-31 DIAGNOSIS — R0602 Shortness of breath: Secondary | ICD-10-CM | POA: Diagnosis not present

## 2017-03-31 DIAGNOSIS — Z87891 Personal history of nicotine dependence: Secondary | ICD-10-CM | POA: Diagnosis not present

## 2017-03-31 DIAGNOSIS — R059 Cough, unspecified: Secondary | ICD-10-CM

## 2017-03-31 DIAGNOSIS — M546 Pain in thoracic spine: Secondary | ICD-10-CM

## 2017-03-31 LAB — COMPREHENSIVE METABOLIC PANEL
ALBUMIN: 4.2 g/dL (ref 3.5–5.0)
ALK PHOS: 56 U/L (ref 38–126)
ALT: 17 U/L (ref 14–54)
AST: 19 U/L (ref 15–41)
Anion gap: 7 (ref 5–15)
BILIRUBIN TOTAL: 0.8 mg/dL (ref 0.3–1.2)
BUN: 23 mg/dL — AB (ref 6–20)
CALCIUM: 9 mg/dL (ref 8.9–10.3)
CO2: 26 mmol/L (ref 22–32)
CREATININE: 1.26 mg/dL — AB (ref 0.44–1.00)
Chloride: 105 mmol/L (ref 101–111)
GFR calc Af Amer: 55 mL/min — ABNORMAL LOW (ref >60)
GFR calc non Af Amer: 47 mL/min — ABNORMAL LOW (ref >60)
GLUCOSE: 115 mg/dL — AB (ref 65–99)
Potassium: 3.6 mmol/L (ref 3.5–5.1)
Sodium: 138 mmol/L (ref 135–145)
TOTAL PROTEIN: 7 g/dL (ref 6.5–8.1)

## 2017-03-31 LAB — TROPONIN I: Troponin I: <0.03 ng/mL (ref <0.03)

## 2017-03-31 LAB — CBC WITH DIFFERENTIAL/PLATELET
BASOS ABS: 0 10*3/uL (ref 0.0–0.1)
BASOS PCT: 0 %
EOS ABS: 0.4 10*3/uL (ref 0.0–0.7)
EOS PCT: 6 %
HCT: 37.6 % (ref 36.0–46.0)
Hemoglobin: 13 g/dL (ref 12.0–15.0)
LYMPHS PCT: 27 %
Lymphs Abs: 2.2 10*3/uL (ref 0.7–4.0)
MCH: 31.4 pg (ref 26.0–34.0)
MCHC: 34.6 g/dL (ref 30.0–36.0)
MCV: 90.8 fL (ref 78.0–100.0)
MONO ABS: 0.9 10*3/uL (ref 0.1–1.0)
Monocytes Relative: 11 %
Neutro Abs: 4.4 10*3/uL (ref 1.7–7.7)
Neutrophils Relative %: 56 %
PLATELETS: 257 10*3/uL (ref 150–400)
RBC: 4.14 MIL/uL (ref 3.87–5.11)
RDW: 12.2 % (ref 11.5–15.5)
WBC: 7.9 10*3/uL (ref 4.0–10.5)

## 2017-03-31 LAB — D-DIMER, QUANTITATIVE: D-Dimer, Quant: 1.42 ug/mL-FEU — ABNORMAL HIGH (ref 0.00–0.50)

## 2017-03-31 MED ORDER — IPRATROPIUM-ALBUTEROL 0.5-2.5 (3) MG/3ML IN SOLN
3.0000 mL | Freq: Once | RESPIRATORY_TRACT | Status: AC
  Administered 2017-03-31: 3 mL via RESPIRATORY_TRACT
  Filled 2017-03-31: qty 3

## 2017-03-31 MED ORDER — IOPAMIDOL (ISOVUE-370) INJECTION 76%
100.0000 mL | Freq: Once | INTRAVENOUS | Status: AC | PRN
  Administered 2017-03-31: 90 mL via INTRAVENOUS

## 2017-03-31 MED ORDER — BENZONATATE 100 MG PO CAPS
100.0000 mg | ORAL_CAPSULE | Freq: Once | ORAL | Status: AC
  Administered 2017-03-31: 100 mg via ORAL
  Filled 2017-03-31: qty 1

## 2017-03-31 MED ORDER — SODIUM CHLORIDE 0.9 % IV BOLUS (SEPSIS)
1000.0000 mL | Freq: Once | INTRAVENOUS | Status: AC
  Administered 2017-03-31: 1000 mL via INTRAVENOUS

## 2017-03-31 MED ORDER — PREDNISONE 20 MG PO TABS
40.0000 mg | ORAL_TABLET | Freq: Once | ORAL | Status: AC
  Administered 2017-03-31: 40 mg via ORAL
  Filled 2017-03-31: qty 2

## 2017-03-31 NOTE — ED Triage Notes (Signed)
Pt reports nonproductive cough. States that last night she coughed and felt a sharp pain in her left upper back. Denies relief with muscle relaxer.

## 2017-04-01 LAB — TROPONIN I: Troponin I: <0.03 ng/mL (ref <0.03)

## 2017-04-01 MED ORDER — BENZONATATE 100 MG PO CAPS: 100.0000 mg | ORAL_CAPSULE | Freq: Three times a day (TID) | ORAL | 0 refills | Status: DC

## 2017-04-01 MED ORDER — PREDNISONE 20 MG PO TABS: 40.0000 mg | ORAL_TABLET | Freq: Every day | ORAL | 0 refills | Status: DC

## 2017-04-01 NOTE — Discharge Instructions (Signed)
Your blood work and imaging has been very reassuring.  No signs of heart attack or blood clot.  Unknown cause of your symptoms.  May be due to your asthma.  Would recommend you taking the steroids starting tomorrow for the next 4 days.  Also continue using albuterol inhaler at home.  Continue taking muscle relaxers and anti-inflammatories for your back pain.  Follow-up with your primary care doctor.  Return to the ED with any worsening symptoms.  Have discussed her CT findings and lab work with you for follow-up.

## 2017-04-01 NOTE — ED Provider Notes (Signed)
[facility] EMERGENCY DEPARTMENT Provider Note   CSN: [id number] Arrival date & time: 03/31/17  2003     History   Chief Complaint Chief Complaint  Patient presents with  . Cough    HPI Patricia Roy is a 56 y.o. female.  HPI 56 year old female past medical history significant for chronic cough, GERD that presents to the emergency department today with complaints of ongoing cough that is nonproductive and pain in her left upper back with pleuritic chest pain.  Patient states that she has had a chronic cough for the past 25 years.  States that she has seen several specialist for same and they have not been able to diagnose her.  States that today she started developing pain in her left upper back after coughing.  She also reports some shortness of breath and pleuritic chest pain.  Patient has tried a muscle relaxer at home without any relief.  Patient denies any cardiac history.  Denies any significant family cardiac history.  Denies any history of DVT/PE, prolonged immobilization, recent hospitalization/surgeries, unilateral leg swelling, calf tenderness, hemoptysis, hormone use.  Patient is a former smoker.  Patient said the pain does not radiate.  Not associated diaphoresis, nausea emesis.  Nothing makes her symptoms better or worse.  Pt denies any fever, chill, ha, vision changes, lightheadedness, dizziness, congestion, neck pain, abd pain, n/v/d, urinary symptoms, change in bowel habits, melena, hematochezia, lower extremity paresthesias.  Past Medical History:  Diagnosis Date  . Allergic state 05/02/2016  . Back pain 06/27/2015  . Blood stool    Hemorrhoids  . Chronic cough 09/08/2014  . Constipation 06/27/2015  . DDD (degenerative disc disease), cervical   . Dermatitis 10/28/2015  . Esophageal reflux 09/19/2014  . Hx: UTI (urinary tract infection) 09/08/2014  . Insomnia 09/19/2014  . Internal hemorrhoid 09/08/2014  . Multiple thyroid nodules 09/19/2014   and vocal cord  nodules  . Preventative health care 10/28/2015  . Rectal bleeding 09/08/2014  . 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 Urine incontinence     Patient Active Problem List   Diagnosis Date Noted  . Allergic state 05/02/2016  . Dermatitis 10/28/2015  . Preventative health care 10/28/2015  . Back pain 06/27/2015  . Constipation 06/27/2015  . Insomnia 09/19/2014  . Esophageal reflux 09/19/2014  . Multiple thyroid nodules 09/19/2014  . Chronic cough 09/08/2014  . Internal hemorrhoid 09/08/2014  . Hx: UTI (urinary tract infection) 09/08/2014  . Pain in lower limb 07/28/2014  . Injury of foot, left, superficial 07/28/2014  . 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 Dyspnea 05/17/2011  . DEPRESSION 01/07/2007  . ASTHMA 01/07/2007    Past Surgical History:  Procedure Laterality Date  . child birth  1992, 2000 and 2004   stitches each time  . TONSILLECTOMY      OB History    Gravida Para Term Preterm AB Living   3       0 3   SAB TAB Ectopic Multiple Live Births       0           Home Medications    Prior to Admission medications   Medication Sig Start Date End Date Taking? Authorizing Provider  albuterol (PROVENTIL HFA;VENTOLIN HFA) 108 (90 Base) MCG/ACT inhaler Inhale 2 puffs into the lungs every 6 (six) hours as needed for wheezing or shortness of breath. 03/13/17   Bradd CanaryBlyth, Stacey A, MD  benzonatate (TESSALON) 100 MG capsule Take 1 capsule (100 mg total) by mouth every 8 (eight)  hours. 04/01/17   Rise MuLeaphart, Kenneth T, PA-C  buPROPion (WELLBUTRIN XL) 150 MG 24 hr tablet Take 1 tablet (150 mg total) by mouth daily. 11/14/16   Bradd CanaryBlyth, Stacey A, MD  fexofenadine (ALLEGRA) 30 MG tablet Take 1 tablet (30 mg total) by mouth as needed. 10/28/15   Bradd CanaryBlyth, Stacey A, MD  ketoconazole (NIZORAL) 2 % cream Reported on 06/17/2015 06/26/14   [provider]  meloxicam (MOBIC) 15 MG tablet TAKE 1/2 TO 1 TABLET BY MOUTH DAILY AS NEEDED FOR PAIN 03/13/17   Bradd CanaryBlyth, Stacey A, MD  methocarbamol  (ROBAXIN) 500 MG tablet Take 1 tablet (500 mg total) by mouth 2 (two) times daily as needed for muscle spasms. 11/14/16   Bradd CanaryBlyth, Stacey A, MD  montelukast (SINGULAIR) 10 MG tablet Take 1 tablet (10 mg total) by mouth at bedtime. 06/09/16   Bradd CanaryBlyth, Stacey A, MD  pantoprazole (PROTONIX) 40 MG tablet Take 40 mg by mouth 2 (two) times daily before a meal.  11/30/14   [provider]  predniSONE (DELTASONE) 20 MG tablet Take 2 tablets (40 mg total) by mouth daily with breakfast. 04/01/17   Demetrios LollLeaphart, Kenneth T, PA-C  triamcinolone cream (KENALOG) 0.1 % Reported on 06/17/2015 06/26/14   [provider]  zolpidem (AMBIEN) 10 MG tablet Take 1 tablet (10 mg total) by mouth at bedtime as needed for sleep. 11/14/16   Bradd CanaryBlyth, Stacey A, MD    Family History Family History  Problem Relation Age of Onset  . Heart disease Mother        H/O valve replacement at 7686  . Stroke Mother   . Pulmonary embolism Mother   . Pancreatic cancer Father   . Alcohol abuse Father   . Alzheimer's disease Maternal Aunt   . Other Sister        chronic lyme  . Pulmonary embolism Brother        DVTs  . Alcohol abuse Brother        recovering  . Migraines Daughter   . Alcohol abuse Brother        recovering  . Alcohol abuse Sister        dementia  . Dementia Sister     Social History Social History   Tobacco Use  . Smoking status: Former Smoker    Packs/day: 0.50    Types: Cigarettes    Last attempt to quit: 04/19/2015    Years since quitting: 1.9  . Smokeless tobacco: Never Used  Substance Use Topics  . Alcohol use: Yes    Alcohol/week: 0.0 oz    Comment: Occasional  . Drug use: No     Allergies   Gabapentin; Paxil [paroxetine]; and Sulfonamide derivatives   Review of Systems Review of Systems  Constitutional: Negative for chills, diaphoresis and fever.  HENT: Negative for congestion.   Eyes: Negative for visual disturbance.  Respiratory: Positive for shortness of breath. Negative for  cough.   Cardiovascular: Negative for palpitations and leg swelling.  Gastrointestinal: Negative for abdominal pain, diarrhea, nausea and vomiting.  Genitourinary: Negative for dysuria, flank pain, frequency, hematuria and urgency.  Musculoskeletal: Positive for back pain. Negative for arthralgias and myalgias.  Skin: Negative for rash.  Neurological: Negative for dizziness, syncope, weakness, light-headedness, numbness and headaches.  Psychiatric/Behavioral: Negative for sleep disturbance. The patient is not nervous/anxious.      Physical Exam Updated Vital Signs BP (!) 151/90   Pulse 94   Temp 97.9 F (36.6 C) (Oral)   Resp 16  Ht 5\' 4"  (1.626 m)   Wt 68 kg (150 lb)   LMP 09/08/2011   SpO2 100%   BMI 25.75 kg/m   Physical Exam  Constitutional: She is oriented to person, place, and time. She appears well-developed and well-nourished.  Non-toxic appearance. No distress.  HENT:  Head: Normocephalic and atraumatic.  Nose: Nose normal.  Mouth/Throat: Oropharynx is clear and moist.  Eyes: Conjunctivae are normal. Pupils are equal, round, and reactive to light. Right eye exhibits no discharge. Left eye exhibits no discharge.  Neck: Normal range of motion. Neck supple. No JVD present. No tracheal deviation present.  Cardiovascular: Normal rate, regular rhythm, normal heart sounds and intact distal pulses. Exam reveals no gallop and no friction rub.  No murmur heard. Pulmonary/Chest: Effort normal and breath sounds normal. No stridor. No respiratory distress. She has no wheezes. She has no rales. She exhibits no tenderness.  No hypoxia or tachypnea.  Abdominal: Soft. Bowel sounds are normal. She exhibits no distension. There is no tenderness. There is no rebound and no guarding.  Musculoskeletal: Normal range of motion.  No lower extremity edema or calf tenderness.  Tender to palpation in the left thoracic paraspinal region.  Lymphadenopathy:    She has no cervical adenopathy.    Neurological: She is alert and oriented to person, place, and time.  Skin: Skin is warm and dry. Capillary refill takes less than 2 seconds. She is not diaphoretic.  Psychiatric: Her behavior is normal. Judgment and thought content normal.  Nursing note and vitals reviewed.    ED Treatments / Results  Labs (all labs ordered are listed, but only abnormal results are displayed) Labs Reviewed  COMPREHENSIVE METABOLIC PANEL - Abnormal; Notable for the following components:      Result Value   Glucose, Bld 115 (*)    BUN 23 (*)    Creatinine, Ser 1.26 (*)    GFR calc non Af Amer 47 (*)    GFR calc Af Amer 55 (*)    All other components within normal limits  D-DIMER, QUANTITATIVE (NOT AT [facility]) - Abnormal; Notable for the following components:   D-Dimer, Quant 1.42 (*)    All other components within normal limits  CBC WITH DIFFERENTIAL/PLATELET  TROPONIN I  TROPONIN I    EKG  EKG Interpretation  Date/Time:  Saturday March 31 2017 21:11:06 EST Ventricular Rate:  89 PR Interval:    QRS Duration: 107 QT Interval:  383 QTC Calculation: 466 R Axis:   29 Text Interpretation:  Sinus rhythm Borderline T abnormalities, anterior leads Baseline wander in lead(s) I III aVL No previous ECGs available Confirmed by Paula Libra ([id number]) on 03/31/2017 11:43:32 PM       Radiology Dg Chest 2 View  Result Date: 03/31/2017 CLINICAL DATA:  Stabbing left upper back pain.  Cough EXAM: CHEST  2 VIEW COMPARISON:  09/08/2014 FINDINGS: Heart and mediastinal contours are within normal limits. No focal opacities or effusions. No acute bony abnormality. IMPRESSION: No active cardiopulmonary disease. Electronically Signed   By: Charlett Nose M.D.   On: 03/31/2017 20:35   Ct Angio Chest Pe W/cm &/or Wo Cm  Result Date: 03/31/2017 CLINICAL DATA:  Upper back pain for several weeks, worse today on the left. Former smoker. EXAM: CT ANGIOGRAPHY CHEST WITH CONTRAST TECHNIQUE: Multidetector CT imaging of the  chest was performed using the standard protocol during bolus administration of intravenous contrast. Multiplanar CT image reconstructions and MIPs were obtained to evaluate the vascular anatomy. CONTRAST:  90mL ISOVUE-370 IOPAMIDOL (ISOVUE-370) INJECTION 76% COMPARISON:  None. FINDINGS: Cardiovascular: Satisfactory opacification of the pulmonary arteries to the segmental level. No evidence of pulmonary embolism. Normal heart size. No pericardial effusion. Mediastinum/Nodes: No aortic atherosclerosis without aneurysm or dissection. No mediastinal nor hilar adenopathy. Trachea and esophagus are nonacute. No thyroid mass or thyromegaly. Lungs/Pleura: Lungs are clear. Small right lower lobe pulmonary bleb. No pleural effusion or pneumothorax. Upper Abdomen: Left hepatic granuloma. No acute abnormality in the upper abdomen. Musculoskeletal: No chest wall abnormality. No acute or significant osseous findings. Review of the MIP images confirms the above findings. IMPRESSION: 1. No acute pulmonary embolus. 2. No active pulmonary disease. 3. Small left hepatic granuloma. Aortic Atherosclerosis (ICD10-I70.0) Electronically Signed   By: Tollie Eth M.D.   On: 03/31/2017 22:40    Procedures Procedures (including critical care time)  Medications Ordered in ED Medications  predniSONE (DELTASONE) tablet 40 mg (40 mg Oral Given 03/31/17 2158)  ipratropium-albuterol (DUONEB) 0.5-2.5 (3) MG/3ML nebulizer solution 3 mL (3 mLs Nebulization Given 03/31/17 2221)  sodium chloride 0.9 % bolus 1,000 mL (0 mLs Intravenous Stopped 03/31/17 2235)  iopamidol (ISOVUE-370) 76 % injection 100 mL (90 mLs Intravenous Contrast Given 03/31/17 2212)  benzonatate (TESSALON) capsule 100 mg (100 mg Oral Given 03/31/17 2314)     Initial Impression / Assessment and Plan / ED Course  I have reviewed the triage vital signs and the nursing notes.  Pertinent labs & imaging results that were available during my care of the patient were  reviewed by me and considered in my medical decision making (see chart for details).     Pt presents to the Ed today with complaints of cp. Patient is to be discharged with recommendation to follow up with PCP in regards to today's hospital visit. Chest pain is not likely of cardiac or pulmonary etiology d/t presentation, d-dimer was positive concern for PE, PE study shows no acute abnormality's, VSS, no tracheal deviation, no JVD or new murmur, RRR, breath sounds equal bilaterally, EKG without any change from prior tracing and shows no signs of acute ischemia, negative delta troponin, and negative CXR.  Patient given breathing treatment and steroids in the ED given history of asthma.  States that she feels improved.  We will sent home with burst of steroids.  Pt has been advised to return to the ED is CP becomes exertional, associated with diaphoresis or nausea, radiates to left jaw/arm, worsens or becomes concerning in any way. Pt appears reliable for follow up and is agreeable to discharge.    \   Final Clinical Impressions(s) / ED Diagnoses   Final diagnoses:  Acute left-sided thoracic back pain  Cough  SOB (shortness of breath)    ED Discharge Orders        Ordered    predniSONE (DELTASONE) 20 MG tablet  Daily with breakfast     04/01/17 0030    benzonatate (TESSALON) 100 MG capsule  Every 8 hours     04/01/17 0030       Rise Mu, PA-C 04/01/17 1454    Mackuen, Cindee Salt, MD 04/03/17 1133

## 2017-04-02 ENCOUNTER — Encounter: Payer: Self-pay | Admitting: Family Medicine

## 2017-04-02 ENCOUNTER — Encounter: Payer: Self-pay | Admitting: Women's Health

## 2017-04-03 MED ORDER — IPRATROPIUM BROMIDE HFA 17 MCG/ACT IN AERS: 2.0000 | INHALATION_SPRAY | Freq: Three times a day (TID) | RESPIRATORY_TRACT | 0 refills | Status: AC

## 2017-04-03 MED ORDER — FLUTICASONE PROPIONATE HFA 220 MCG/ACT IN AERO: 2.0000 | INHALATION_SPRAY | Freq: Two times a day (BID) | RESPIRATORY_TRACT | 0 refills | Status: DC

## 2017-04-03 MED ORDER — ALBUTEROL SULFATE HFA 108 (90 BASE) MCG/ACT IN AERS: 2.0000 | INHALATION_SPRAY | Freq: Four times a day (QID) | RESPIRATORY_TRACT | 5 refills | Status: DC | PRN

## 2017-04-03 NOTE — Telephone Encounter (Signed)
Please refill her Albuterol MDI and add Flovent 220 mcg 1 puff po bid x 14 days, Atrovent MD! 2 puffs po tid x 7 days. Let her know we may run into trouble with insurance but we will try to prescribe. Come in if we have trouble or symptoms worsen

## 2017-04-04 ENCOUNTER — Encounter: Payer: Self-pay | Admitting: Family Medicine

## 2017-04-05 ENCOUNTER — Encounter: Payer: Self-pay | Admitting: Medical

## 2017-04-05 ENCOUNTER — Ambulatory Visit (INDEPENDENT_AMBULATORY_CARE_PROVIDER_SITE_OTHER): Payer: 59 | Admitting: Medical

## 2017-04-05 VITALS — BP 100/77 | HR 80 | Temp 97.5°F | Resp 16 | Wt 156.4 lb

## 2017-04-05 DIAGNOSIS — N3001 Acute cystitis with hematuria: Secondary | ICD-10-CM

## 2017-04-05 DIAGNOSIS — R300 Dysuria: Secondary | ICD-10-CM

## 2017-04-05 DIAGNOSIS — M545 Low back pain: Secondary | ICD-10-CM | POA: Diagnosis not present

## 2017-04-05 LAB — POC URINALSYSI DIPSTICK (AUTOMATED)
Bilirubin, UA: NEGATIVE
Glucose, UA: NEGATIVE
Ketones, UA: NEGATIVE
NITRITE UA: POSITIVE
PH UA: 6 (ref 5.0–8.0)
Spec Grav, UA: 1.02 (ref 1.010–1.025)
UROBILINOGEN UA: NEGATIVE U/dL — AB

## 2017-04-05 MED ORDER — PHENAZOPYRIDINE HCL 200 MG PO TABS: 200.0000 mg | ORAL_TABLET | Freq: Three times a day (TID) | ORAL | 0 refills | Status: DC | PRN

## 2017-04-05 MED ORDER — CIPROFLOXACIN HCL 500 MG PO TABS: 500.0000 mg | ORAL_TABLET | Freq: Two times a day (BID) | ORAL | 0 refills | Status: DC

## 2017-04-05 NOTE — Progress Notes (Signed)
Subjective:    Patient ID: Patricia Roy, female    DOB: January 21, 1961, 56 y.o.   MRN: [id number]  HPI  Pt in for evaluation.  Pt in today reporting urinary symptoms for 2 days.  Dysuria- yes Frequent urination-yes Hesitancy-no Suprapubic pressure-yes Fever-no chills-yes Nausea-no Vomiting-no CVA pain-faint rt cva tenderness. History of UTI- 1-2 infection a year.(years past had to take preventative med) Gross hematuria-no  Pt seen in ED. She was wheezy, sob and had some back pain. D-dimer elevated and ct chest negative for d dimer. Pt was given neb treatment and given prednisone. She used oral prednisone for 4 days. Each day was 40 mg . Pt states breathing a lot better. She just got atrovent today. Start flovent last night. She has albuterol at home.   Review of Systems  Constitutional: Positive for chills. Negative for diaphoresis, fatigue and fever.  Respiratory: Negative for cough, chest tightness, shortness of breath and wheezing.   Cardiovascular: Negative for chest pain and palpitations.  Gastrointestinal: Negative for abdominal pain, blood in stool, constipation, diarrhea and nausea.  Genitourinary: Positive for frequency and urgency. Negative for difficulty urinating, flank pain, vaginal bleeding, vaginal discharge and vaginal pain.  Musculoskeletal: Positive for back pain.  Skin: Negative for rash.  Hematological: Negative for adenopathy. Does not bruise/bleed easily.  Psychiatric/Behavioral: Negative for behavioral problems, confusion, hallucinations and sleep disturbance. The patient is not nervous/anxious.     Past Medical History:  Diagnosis Date  . Allergic state 05/02/2016  . Back pain 06/27/2015  . Blood stool    Hemorrhoids  . Chronic cough 09/08/2014  . Constipation 06/27/2015  . DDD (degenerative disc disease), cervical   . Dermatitis 10/28/2015  . Esophageal reflux 09/19/2014  . Hx: UTI (urinary tract infection) 09/08/2014  . Insomnia 09/19/2014  .  Internal hemorrhoid 09/08/2014  . Multiple thyroid nodules 09/19/2014   and vocal cord nodules  . Preventative health care 10/28/2015  . Rectal bleeding 09/08/2014  . 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 Urine incontinence      Social History   Socioeconomic History  . Marital status: Married    Spouse name: Not on file  . Number of children: 3  . Years of education: 2816  . Highest education level: Not on file  Social Needs  . Financial resource strain: Not on file  . Food insecurity - worry: Not on file  . Food insecurity - inability: Not on file  . Transportation needs - medical: Not on file  . Transportation needs - non-medical: Not on file  Occupational History  . Occupation: Utilization Review for [employer]    Employer: [employer]  Tobacco Use  . Smoking status: Former Smoker    Packs/day: 0.50    Types: Cigarettes    Last attempt to quit: 04/19/2015    Years since quitting: 1.9  . Smokeless tobacco: Never Used  Substance and Sexual Activity  . Alcohol use: Yes    Alcohol/week: 0.0 oz    Comment: Occasional  . Drug use: No  . Sexual activity: Yes    Comment: lives with husband (heart diseaseand duaghters, no dietary restrictions, works [employer],...INTERCOURSE AGE 69, SEXUALPARTNERS LESS THAN 5,des neg  Other Topics Concern  . Not on file  Social History Narrative  . Not on file    Past Surgical History:  Procedure Laterality Date  . child birth  1992, 2000 and 2004   stitches each time  . TONSILLECTOMY      Family History  Problem Relation Age of Onset  . Heart disease Mother        H/O valve replacement at 6986  . Stroke Mother   . Pulmonary embolism Mother   . Pancreatic cancer Father   . Alcohol abuse Father   . Alzheimer's disease Maternal Aunt   . Other Sister        chronic lyme  . Pulmonary embolism Brother        DVTs  . Alcohol abuse Brother        recovering  . Migraines Daughter   . Alcohol abuse Brother        recovering  . Alcohol  abuse Sister        dementia  . Dementia Sister     Allergies  Allergen Reactions  . Gabapentin Rash    Possible urticaria   . Paxil [Paroxetine]   . Sulfonamide Derivatives     Current Outpatient Medications on File Prior to Visit  Medication Sig Dispense Refill  . albuterol (PROVENTIL HFA;VENTOLIN HFA) 108 (90 Base) MCG/ACT inhaler Inhale 2 puffs into the lungs every 6 (six) hours as needed for wheezing or shortness of breath. 18 g 5  . benzonatate (TESSALON) 100 MG capsule Take 1 capsule (100 mg total) by mouth every 8 (eight) hours. 21 capsule 0  . buPROPion (WELLBUTRIN XL) 150 MG 24 hr tablet Take 1 tablet (150 mg total) by mouth daily. 90 tablet 3  . fexofenadine (ALLEGRA) 30 MG tablet Take 1 tablet (30 mg total) by mouth as needed.    . fluticasone (FLOVENT HFA) 220 MCG/ACT inhaler Inhale 2 puffs into the lungs 2 (two) times daily for 14 days. 12 g 0  . ipratropium (ATROVENT HFA) 17 MCG/ACT inhaler Inhale 2 puffs into the lungs 3 (three) times daily for 7 days. 12.9 g 0  . ketoconazole (NIZORAL) 2 % cream Reported on 06/17/2015  3  . meloxicam (MOBIC) 15 MG tablet TAKE 1/2 TO 1 TABLET BY MOUTH DAILY AS NEEDED FOR PAIN 30 tablet 0  . methocarbamol (ROBAXIN) 500 MG tablet Take 1 tablet (500 mg total) by mouth 2 (two) times daily as needed for muscle spasms. 45 tablet 3  . montelukast (SINGULAIR) 10 MG tablet Take 1 tablet (10 mg total) by mouth at bedtime. 90 tablet 3  . pantoprazole (PROTONIX) 40 MG tablet Take 40 mg by mouth 2 (two) times daily before a meal.     . predniSONE (DELTASONE) 20 MG tablet Take 2 tablets (40 mg total) by mouth daily with breakfast. 8 tablet 0  . triamcinolone cream (KENALOG) 0.1 % Reported on 06/17/2015  3  . zolpidem (AMBIEN) 10 MG tablet Take 1 tablet (10 mg total) by mouth at bedtime as needed for sleep. 15 tablet 5   No current facility-administered medications on file prior to visit.     BP 100/77   Pulse 80   Temp (!) 97.5 F (36.4 C) (Oral)    Resp 16   Wt 156 lb 6.4 oz (70.9 kg)   LMP 09/08/2011   SpO2 100%   BMI 26.85 kg/m       Objective:   Physical Exam  General  Mental Status - Alert. General Appearance - Well groomed. Not in acute distress.  Skin Rashes- No Rashes.   Neck Neck- Supple. No Masses.   Chest and Lung Exam Auscultation: Breath Sounds:-Clear even and unlabored.  Cardiovascular Auscultation:Rythm- Regular, rate and rhythm. Murmurs & Other Heart Sounds:Ausculatation of the heart reveal- No Murmurs.  Lymphatic Head & Neck General Head & Neck Lymphatics: Bilateral: Description- No Localized lymphadenopathy.  Back-no CVA tenderness  Abdomen soft, nondistended, positive bowel sounds.  Minimal faint suprapubic tenderness to palpation.       Assessment & Plan:  Your appear to have a urinary tract infection. I am prescribing cipro antibiotic for the probable infection. Hydrate well. I am sending out a urine culture. During the interim if your signs and symptoms worsen rather than improving please notify us. We will notify your when the culture results are back.  Prescribing pyridium for bladder pain.  I would start atrovent and flovent. If your wheezing returns I could rx tapered prednisone but not necessary presently. Give me update if needed  Follow up in 7 days or as needed.  Taiesha Bovard, Ramon Dredge, PA-C

## 2017-04-05 NOTE — Patient Instructions (Signed)
.  Your appear to have a urinary tract infection. I am prescribing cipro antibiotic for the probable infection. Hydrate well. I am sending out a urine culture. During the interim if your signs and symptoms worsen rather than improving please notify us. We will notify your when the culture results are back.  Prescribing pyridium for bladder pain.  I would start atrovent and flovent. If your wheezing returns I could rx tapered prednisone but not necessary presently. Give me update if needed  Follow up in 7 days or as needed.

## 2017-04-05 NOTE — Telephone Encounter (Signed)
Patient calling and checking status. Please advise. Pharmacy [facility] [address]

## 2017-04-06 ENCOUNTER — Encounter: Payer: Self-pay | Admitting: Medical

## 2017-04-06 NOTE — Addendum Note (Signed)
Addended by: Orlene OchRENCE, Haidee Stogsdill N on: 04/06/2017 09:25 AM   Modules accepted: Orders

## 2017-04-08 LAB — URINE CULTURE
MICRO NUMBER:: 81434025
SPECIMEN QUALITY: ADEQUATE

## 2017-04-14 ENCOUNTER — Encounter: Payer: Self-pay | Admitting: Medical

## 2017-04-17 ENCOUNTER — Telehealth: Payer: Self-pay | Admitting: Medical

## 2017-04-17 MED ORDER — BENZONATATE 100 MG PO CAPS: 100.0000 mg | ORAL_CAPSULE | Freq: Three times a day (TID) | ORAL | 0 refills | Status: DC

## 2017-04-17 NOTE — Telephone Encounter (Signed)
Refilled benzonatate at pt request. Seen by ED per her report and wanted refill of benzonatate.

## 2017-04-20 ENCOUNTER — Other Ambulatory Visit: Payer: 59

## 2017-04-20 ENCOUNTER — Encounter: Payer: Self-pay | Admitting: Medical

## 2017-04-20 DIAGNOSIS — R300 Dysuria: Secondary | ICD-10-CM

## 2017-04-22 ENCOUNTER — Other Ambulatory Visit: Payer: Self-pay | Admitting: Family Medicine

## 2017-04-23 ENCOUNTER — Telehealth: Payer: Self-pay | Admitting: Medical

## 2017-04-23 NOTE — Telephone Encounter (Signed)
  [name],  This is message pt sent me. When I get her new urine study arranged. Will you call patient and touch base with her. Will you come by my office/station sometime to discuss.  Hello  Patricia indicated that I could go to the lab to have my urine Re-tested. Her exacts words were that I did not need to set this up previously, I could come by any day after work.  I went today. Patricia came out,  said there was already a order in and buzzed me back into the lab.  The girl in the lab, [place] Ranch[place]ristie,  ,  Indicated " Patricia did not know how it runs in the lab. " "Did  I stopped at the front desk?"  When I proceeded to tell , "yes they buzzed me back" she was very  rude and said" that is not what I just asked you" I went back to the front desk at her request.  [facility] for [facility], and Lorene DyChristie said" Patricia is with a patient". I had just spoke to her 5 mins prior.   I have never been treated like this before. 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  When I tried to ask her why I was being addressed so rudely, she walked away.  Please refer me to another location for the urine test, for I was humiliated today.

## 2017-04-24 ENCOUNTER — Telehealth: Payer: Self-pay | Admitting: Family Medicine

## 2017-04-24 ENCOUNTER — Encounter: Payer: Self-pay | Admitting: Women's Health

## 2017-04-24 NOTE — Telephone Encounter (Signed)
PatriciaAble came into the Lab as I pulled up [place] to push out. She stated she needed to give a urine sample. I decided to take care of her since my co-worker Angie was busy with a younger patient. When I could not find Mrs. Hodgkin on [employer] Sagier's schedule I asked if she had a lab appt so I could pull her up there. She got extremely upset, yelling that she had just seen [facility] and she sent her to the lab,so she did not need an appt. I double checked both schedules again and tried to explain the protocol.  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 When I handed her the cup she stood up and stepped toward me yelling that she did not appreciate the way I talked to her when  I explained the protocol. I stepped back from Mrs. Sharol Givenearson at that time because as she stood up yelling her hand went back and in the air, I said I am going to get my manager because I no longer feel comfortable addressing you about the situation. I was told by Carmel SacramentoAngela Baynes that she then put the empty specimen cup on the counter, stating that she had done noting wrong and was leaving, asking for my name. I reported to [place] and Selena BattenKim, Mrs. Sharol Givenearson was gone when we returned to the front of the office.

## 2017-04-24 NOTE — Telephone Encounter (Signed)
Requesting:ambien Contract:yes UDS:yes Last OV:04/05/17 Next ZO:XWRUOV:none Last Refill:11/14/16   #[id number]   Please advise

## 2017-04-24 NOTE — Telephone Encounter (Signed)
Thank you for your documentation. Am unwilling to let my staff feel uncomfortable. I will dismiss patient from the practice and cover her care for the next 30 days

## 2017-04-25 ENCOUNTER — Other Ambulatory Visit: Payer: 59

## 2017-04-25 ENCOUNTER — Other Ambulatory Visit: Payer: Self-pay | Admitting: Women's Health

## 2017-04-25 ENCOUNTER — Encounter: Payer: Self-pay | Admitting: Women's Health

## 2017-04-25 DIAGNOSIS — R399 Unspecified symptoms and signs involving the genitourinary system: Secondary | ICD-10-CM

## 2017-04-25 NOTE — Telephone Encounter (Signed)
Yes that lab work is not urgent her new PCP can investigate

## 2017-04-25 NOTE — Telephone Encounter (Signed)
I was in processing of trying to arrange urine micro study then saw note to NP/gyn regarding her persisting urinary symptoms and her comments that she is in process of finding new pcp. She may already has scheduled that.

## 2017-04-26 LAB — URINALYSIS W MICROSCOPIC + REFLEX CULTURE
Bacteria, UA: NONE SEEN /HPF
Bilirubin Urine: NEGATIVE
GLUCOSE, UA: NEGATIVE
HYALINE CAST: NONE SEEN /LPF
Hgb urine dipstick: NEGATIVE
Ketones, ur: NEGATIVE
Nitrites, Initial: NEGATIVE
PH: 6.5 (ref 5.0–8.0)
Protein, ur: NEGATIVE
RBC / HPF: NONE SEEN /HPF (ref 0–2)
SPECIFIC GRAVITY, URINE: 1.013 (ref 1.001–1.03)

## 2017-04-30 ENCOUNTER — Encounter: Payer: Self-pay | Admitting: Women's Health

## 2017-05-28 ENCOUNTER — Other Ambulatory Visit: Payer: Self-pay

## 2017-05-28 MED ORDER — MONTELUKAST SODIUM 10 MG PO TABS: 10.0000 mg | ORAL_TABLET | Freq: Every day | ORAL | 3 refills | Status: AC

## 2017-10-19 ENCOUNTER — Other Ambulatory Visit: Payer: Self-pay | Admitting: *Deleted

## 2017-10-19 MED ORDER — ALBUTEROL SULFATE HFA 108 (90 BASE) MCG/ACT IN AERS: 2.0000 | INHALATION_SPRAY | Freq: Four times a day (QID) | RESPIRATORY_TRACT | 1 refills | Status: AC | PRN

## 2017-12-26 ENCOUNTER — Encounter: Payer: 59 | Admitting: Women's Health

## 2018-03-17 HISTORY — PX: NASAL SINUS SURGERY: SHX719

## 2018-03-28 ENCOUNTER — Other Ambulatory Visit: Payer: Self-pay | Admitting: Family Medicine

## 2018-04-29 ENCOUNTER — Encounter: Payer: Self-pay | Admitting: Women's Health

## 2018-04-29 ENCOUNTER — Ambulatory Visit (INDEPENDENT_AMBULATORY_CARE_PROVIDER_SITE_OTHER): Payer: Managed Care, Other (non HMO) | Admitting: Women's Health

## 2018-04-29 VITALS — BP 110/70 | Ht 63.5 in | Wt 145.4 lb

## 2018-04-29 DIAGNOSIS — Z1151 Encounter for screening for human papillomavirus (HPV): Secondary | ICD-10-CM | POA: Diagnosis not present

## 2018-04-29 DIAGNOSIS — Z13220 Encounter for screening for lipoid disorders: Secondary | ICD-10-CM

## 2018-04-29 DIAGNOSIS — Z01419 Encounter for gynecological examination (general) (routine) without abnormal findings: Secondary | ICD-10-CM

## 2018-04-29 NOTE — Progress Notes (Signed)
Electa SniffCarol M Marrs 1960/11/14 [id number]    History:    Presents for annual exam.  Postmenopausal on no HRT with no bleeding.  Normal Pap and mammogram history.  Normal DEXA at work.  2016- colonoscopy.  Benign thyroid nodules ultrasound 05/2017.  Reports had a bad year with dental abscess and recurrent UTIs, currently on preventative UTI medication and doing much better.  Has follow-up scheduled with urologist.  Anxiety and depression stable on Wellbutrin.  Past medical history, past surgical history, family history and social history were all reviewed and documented in the EPIC chart.  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  3 daughters all doing well oldest daughter completing masters youngest daughter in ninth grade and has had Gardasil.  ROS:  A ROS was performed and pertinent positives and negatives are included.  Exam:  Vitals:   04/29/18 0813  BP: 110/70  Weight: 145 lb 6.4 oz (66 kg)  Height: 5' 3.5" (1.613 m)   Body mass index is 25.35 kg/m.   General appearance:  Normal Thyroid:  Symmetrical, normal in size, without palpable masses or nodularity. Respiratory  Auscultation:  Clear without wheezing or rhonchi Cardiovascular  Auscultation:  Regular rate, without rubs, murmurs or gallops  Edema/varicosities:  Not grossly evident Abdominal  Soft,nontender, without masses, guarding or rebound.  Liver/spleen:  No organomegaly noted  Hernia:  None appreciated  Skin  Inspection:  Grossly normal   Breasts: Examined lying and sitting.     Right: Without masses, retractions, discharge or axillary adenopathy.     Left: Without masses, retractions, discharge or axillary adenopathy. Gentitourinary   Inguinal/mons:  Normal without inguinal adenopathy  External genitalia:  Normal  BUS/Urethra/Skene's glands:  Normal  Vagina:  Normal  Cervix:  Normal  Uterus:  normal in size, shape and contour.  Midline and mobile  Adnexa/parametria:     Rt: Without  masses or tenderness.   Lt: Without masses or tenderness.  Anus and perineum: Normal  Digital rectal exam: Normal sphincter tone without palpated masses or tenderness  Assessment/Plan:  58 y.o. MWF G3, P3 for annual exam with no complaints.  Postmenopausal/no HRT/no bleeding Recurrent UTIs with resistant infection on preventative daily medication per urologist Asthma/Anxiety/depression primary care manages meds  Plan: Request labs, process of changing primary care, CBC, CMP, lipid panel, Pap with HR HPV typing, new screening guidelines reviewed.  SBEs, continue annual screening mammogram, calcium rich foods, vitamin D 2000 daily encouraged.  Reviewed importance of weightbearing and balance type exercise, fall prevention discussed.    Harrington Challengerancy J Shaneil Yazdi [facility]NP, 9:01 AM 04/29/2018

## 2018-04-29 NOTE — Patient Instructions (Signed)
Health Maintenance for Postmenopausal Women Menopause is a normal process in which your reproductive ability comes to an end. This process happens gradually over a span of months to years, usually between the ages of 62 and 89. Menopause is complete when you have missed 12 consecutive menstrual periods. It is important to talk with your health care provider about some of the most common conditions that affect postmenopausal women, such as heart disease, cancer, and bone loss (osteoporosis). Adopting a healthy lifestyle and getting preventive care can help to promote your health and wellness. Those actions can also lower your chances of developing some of these common conditions. What should I know about menopause? During menopause, you may experience a number of symptoms, such as:  Moderate-to-severe hot flashes.  Night sweats.  Decrease in sex drive.  Mood swings.  Headaches.  Tiredness.  Irritability.  Memory problems.  Insomnia. Choosing to treat or not to treat menopausal changes is an individual decision that you make with your health care provider. What should I know about hormone replacement therapy and supplements? Hormone therapy products are effective for treating symptoms that are associated with menopause, such as hot flashes and night sweats. Hormone replacement carries certain risks, especially as you become older. If you are thinking about using estrogen or estrogen with progestin treatments, discuss the benefits and risks with your health care provider. What should I know about heart disease and stroke? Heart disease, heart attack, and stroke become more likely as you age. This may be due, in part, to the hormonal changes that your body experiences during menopause. These can affect how your body processes dietary fats, triglycerides, and cholesterol. Heart attack and stroke are both medical emergencies. There are many things that you can do to help prevent heart disease  and stroke:  Have your blood pressure checked at least every 1-2 years. High blood pressure causes heart disease and increases the risk of stroke.  If you are 79-72 years old, ask your health care provider if you should take aspirin to prevent a heart attack or a stroke.  Do not use any tobacco products, including cigarettes, chewing tobacco, or electronic cigarettes. If you need help quitting, ask your health care provider.  It is important to eat a healthy diet and maintain a healthy weight. ? Be sure to include plenty of vegetables, fruits, low-fat dairy products, and lean protein. ? Avoid eating foods that are high in solid fats, added sugars, or salt (sodium).  Get regular exercise. This is one of the most important things that you can do for your health. ? Try to exercise for at least 150 minutes each week. The type of exercise that you do should increase your heart rate and make you sweat. This is known as moderate-intensity exercise. ? Try to do strengthening exercises at least twice each week. Do these in addition to the moderate-intensity exercise.  Know your numbers.Ask your health care provider to check your cholesterol and your blood glucose. Continue to have your blood tested as directed by your health care provider.  What should I know about cancer screening? There are several types of cancer. Take the following steps to reduce your risk and to catch any cancer development as early as possible. Breast Cancer  Practice breast self-awareness. ? This means understanding how your breasts normally appear and feel. ? It also means doing regular breast self-exams. Let your health care provider know about any changes, no matter how small.  If you are 40 or  older, have a clinician do a breast exam (clinical breast exam or CBE) every year. Depending on your age, family history, and medical history, it may be recommended that you also have a yearly breast X-ray (mammogram).  If you  have a family history of breast cancer, talk with your health care provider about genetic screening.  If you are at high risk for breast cancer, talk with your health care provider about having an MRI and a mammogram every year.  Breast cancer (BRCA) gene test is recommended for women who have family members with BRCA-related cancers. Results of the assessment will determine the need for genetic counseling and BRCA1 and for BRCA2 testing. BRCA-related cancers include these types: ? Breast. This occurs in males or females. ? Ovarian. ? Tubal. This may also be called fallopian tube cancer. ? Cancer of the abdominal or pelvic lining (peritoneal cancer). ? Prostate. ? Pancreatic. Cervical, Uterine, and Ovarian Cancer Your health care provider may recommend that you be screened regularly for cancer of the pelvic organs. These include your ovaries, uterus, and vagina. This screening involves a pelvic exam, which includes checking for microscopic changes to the surface of your cervix (Pap test).  For women ages 21-65, health care providers may recommend a pelvic exam and a Pap test every three years. For women ages 39-65, they may recommend the Pap test and pelvic exam, combined with testing for human papilloma virus (HPV), every five years. Some types of HPV increase your risk of cervical cancer. Testing for HPV may also be done on women of any age who have unclear Pap test results.  Other health care providers may not recommend any screening for nonpregnant women who are considered low risk for pelvic cancer and have no symptoms. Ask your health care provider if a screening pelvic exam is right for you.  If you have had past treatment for cervical cancer or a condition that could lead to cancer, you need Pap tests and screening for cancer for at least 20 years after your treatment. If Pap tests have been discontinued for you, your risk factors (such as having a new sexual partner) need to be reassessed  to determine if you should start having screenings again. Some women have medical problems that increase the chance of getting cervical cancer. In these cases, your health care provider may recommend that you have screening and Pap tests more often.  If you have a family history of uterine cancer or ovarian cancer, talk with your health care provider about genetic screening.  If you have vaginal bleeding after reaching menopause, tell your health care provider.  There are currently no reliable tests available to screen for ovarian cancer. Lung Cancer Lung cancer screening is recommended for adults 57-50 years old who are at high risk for lung cancer because of a history of smoking. A yearly low-dose CT scan of the lungs is recommended if you:  Currently smoke.  Have a history of at least 30 pack-years of smoking and you currently smoke or have quit within the past 15 years. A pack-year is smoking an average of one pack of cigarettes per day for one year. Yearly screening should:  Continue until it has been 15 years since you quit.  Stop if you develop a health problem that would prevent you from having lung cancer treatment. Colorectal Cancer  This type of cancer can be detected and can often be prevented.  Routine colorectal cancer screening usually begins at age 12 and continues through  age 75.  If you have risk factors for colon cancer, your health care provider may recommend that you be screened at an earlier age.  If you have a family history of colorectal cancer, talk with your health care provider about genetic screening.  Your health care provider may also recommend using home test kits to check for hidden blood in your stool.  A small camera at the end of a tube can be used to examine your colon directly (sigmoidoscopy or colonoscopy). This is done to check for the earliest forms of colorectal cancer.  Direct examination of the colon should be repeated every 5-10 years until  age 75. However, if early forms of precancerous polyps or small growths are found or if you have a family history or genetic risk for colorectal cancer, you may need to be screened more often. Skin Cancer  Check your skin from head to toe regularly.  Monitor any moles. Be sure to tell your health care provider: ? About any new moles or changes in moles, especially if there is a change in a mole's shape or color. ? If you have a mole that is larger than the size of a pencil eraser.  If any of your family members has a history of skin cancer, especially at a  age, talk with your health care provider about genetic screening.  Always use sunscreen. Apply sunscreen liberally and repeatedly throughout the day.  Whenever you are outside, protect yourself by wearing long sleeves, pants, a wide-brimmed hat, and sunglasses. What should I know about osteoporosis? Osteoporosis is a condition in which bone destruction happens more quickly than new bone creation. After menopause, you may be at an increased risk for osteoporosis. To help prevent osteoporosis or the bone fractures that can happen because of osteoporosis, the following is recommended:  If you are 19-50 years old, get at least 1,000 mg of calcium and at least 600 mg of vitamin D per day.  If you are older than age 50 but er than age 70, get at least 1,200 mg of calcium and at least 600 mg of vitamin D per day.  If you are older than age 70, get at least 1,200 mg of calcium and at least 800 mg of vitamin D per day. Smoking and excessive alcohol intake increase the risk of osteoporosis. Eat foods that are rich in calcium and vitamin D, and do weight-bearing exercises several times each week as directed by your health care provider. What should I know about how menopause affects my mental health? Depression may occur at any age, but it is more common as you become older. Common symptoms of depression include:  Low or sad  mood.  Changes in sleep patterns.  Changes in appetite or eating patterns.  Feeling an overall lack of motivation or enjoyment of activities that you previously enjoyed.  Frequent crying spells. Talk with your health care provider if you think that you are experiencing depression. What should I know about immunizations? It is important that you get and maintain your immunizations. These include:  Tetanus, diphtheria, and pertussis (Tdap) booster vaccine.  Influenza every year before the flu season begins.  Pneumonia vaccine.  Shingles vaccine. Your health care provider may also recommend other immunizations. This information is not intended to replace advice given to you by your health care provider. Make sure you discuss any questions you have with your health care provider. Document Released: 05/26/2005 Document Revised: 10/22/2015 Document Reviewed: 01/05/2015 Elsevier Interactive Patient Education    2019 [place].

## 2018-04-29 NOTE — Addendum Note (Signed)
Addended by: Berna Spare A on: 04/29/2018 10:22 AM   Modules accepted: Orders

## 2018-04-30 LAB — LIPID PANEL
CHOLESTEROL: 185 mg/dL (ref <200)
HDL: 77 mg/dL (ref >50)
LDL Cholesterol (Calc): 91 mg/dL (calc)
NON-HDL CHOLESTEROL (CALC): 108 mg/dL (ref <130)
TRIGLYCERIDES: 76 mg/dL (ref <150)
Total CHOL/HDL Ratio: 2.4 (calc) (ref <5.0)

## 2018-04-30 LAB — CBC WITH DIFFERENTIAL/PLATELET
ABSOLUTE MONOCYTES: 910 {cells}/uL (ref 200–950)
BASOS ABS: 46 {cells}/uL (ref 0–200)
Basophils Relative: 0.5 %
EOS ABS: 364 {cells}/uL (ref 15–500)
Eosinophils Relative: 4 %
HEMATOCRIT: 38.5 % (ref 35.0–45.0)
Hemoglobin: 13 g/dL (ref 11.7–15.5)
LYMPHS ABS: 1856 {cells}/uL (ref 850–3900)
MCH: 30.7 pg (ref 27.0–33.0)
MCHC: 33.8 g/dL (ref 32.0–36.0)
MCV: 90.8 fL (ref 80.0–100.0)
MPV: 10 fL (ref 7.5–12.5)
Monocytes Relative: 10 %
NEUTROS PCT: 65.1 %
Neutro Abs: 5924 cells/uL (ref 1500–7800)
Platelets: 323 10*3/uL (ref 140–400)
RBC: 4.24 10*6/uL (ref 3.80–5.10)
RDW: 12 % (ref 11.0–15.0)
Total Lymphocyte: 20.4 %
WBC: 9.1 10*3/uL (ref 3.8–10.8)

## 2018-04-30 LAB — COMPREHENSIVE METABOLIC PANEL
AG RATIO: 1.9 (calc) (ref 1.0–2.5)
ALKALINE PHOSPHATASE (APISO): 59 U/L (ref 33–130)
ALT: 14 U/L (ref 6–29)
AST: 15 U/L (ref 10–35)
Albumin: 4.1 g/dL (ref 3.6–5.1)
BUN: 23 mg/dL (ref 7–25)
CALCIUM: 9.3 mg/dL (ref 8.6–10.4)
CO2: 28 mmol/L (ref 20–32)
Chloride: 103 mmol/L (ref 98–110)
Creat: 1.04 mg/dL (ref 0.50–1.05)
GLUCOSE: 89 mg/dL (ref 65–99)
Globulin: 2.2 g/dL (calc) (ref 1.9–3.7)
Potassium: 4.3 mmol/L (ref 3.5–5.3)
Sodium: 139 mmol/L (ref 135–146)
Total Bilirubin: 0.5 mg/dL (ref 0.2–1.2)
Total Protein: 6.3 g/dL (ref 6.1–8.1)

## 2018-05-02 LAB — PAP, TP IMAGING W/ HPV RNA, RFLX HPV TYPE 16,18/45: HPV DNA High Risk: DETECTED — AB

## 2018-05-02 LAB — HPV TYPE 16 AND 18/45 RNA
HPV TYPE 16 RNA: NOT DETECTED
HPV Type 18/45 RNA: NOT DETECTED

## 2018-05-28 ENCOUNTER — Ambulatory Visit (INDEPENDENT_AMBULATORY_CARE_PROVIDER_SITE_OTHER): Payer: Managed Care, Other (non HMO) | Admitting: Obstetrics & Gynecology

## 2018-05-28 ENCOUNTER — Encounter: Payer: Self-pay | Admitting: Obstetrics & Gynecology

## 2018-05-28 VITALS — BP 140/86

## 2018-05-28 DIAGNOSIS — R87810 Cervical high risk human papillomavirus (HPV) DNA test positive: Secondary | ICD-10-CM | POA: Diagnosis not present

## 2018-05-28 DIAGNOSIS — R87610 Atypical squamous cells of undetermined significance on cytologic smear of cervix (ASC-US): Secondary | ICD-10-CM | POA: Diagnosis not present

## 2018-05-28 DIAGNOSIS — Z113 Encounter for screening for infections with a predominantly sexual mode of transmission: Secondary | ICD-10-CM | POA: Diagnosis not present

## 2018-05-28 NOTE — Progress Notes (Signed)
    Patricia Roy 08/09/1960 [id number]        58 y.o.  G3P3L3 Married x 10 years  RP: ASCUS/HPV HR positive 04/29/2018  HPI:  Per patient, remote h/o abnormal pap/cervical Bx?  No cervical treatment.  Normal Paps x many years.  Previous Pap 10/2014 negative, HPV HR negative.  No pelvic pain.  Normal vaginal secretions.  Menopause on no HRT.  No PMB.  No known infidelity.   OB History  Gravida Para Term Preterm AB Living  3       0 3  SAB TAB Ectopic Multiple Live Births      0        # Outcome Date GA Lbr Len/2nd Weight Sex Delivery Anes PTL Lv  3 Gravida           2 Gravida           1 Gravida             Past medical history,surgical history, problem list, medications, allergies, family history and social history were all reviewed and documented in the EPIC chart.   Directed ROS with pertinent positives and negatives documented in the history of present illness/assessment and plan.  Exam:  Vitals:   05/28/18 1431  BP: 140/86   General appearance:  Normal  Colposcopy Procedure Note Patricia Roy 05/28/2018  Indications:  Procedure Details  The risks and benefits of the procedure and Verbal informed consent obtained.  Speculum placed in vagina and excellent visualization of cervix achieved, cervix swabbed x 3 with acetic acid solution.  Findings:  Cervix colposcopy: Physical Exam Genitourinary:       Vaginal colposcopy: Normal  Vulvar colposcopy: Grossly normal  Perirectal colposcopy: Grossly normal  The cervix was sprayed with Hurricane before performing the cervical biopsies.  Specimens:  Cervical biopsy at 3, 6 and 9 O'clock.  Complications:  None, good hemostasis with Silver Nitrate . Plan:  Management per results   Assessment/Plan:  58 y.o. G3P0003   1. 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  HPV 16-18-45 were negative.  Possible remote history of abnormal Pap test with cervical  biopsies.  No history of cervical treatment.  Pap test normal for many years since then with last one prior to this year in July 2016 showing a negative resolve with negative high-risk HPV.  Significance of high risk HPV and abnormal Pap test discussed with patient.  Colposcopy procedure reviewed.  Patient informed of findings at the time of colposcopy.  Will manage per cervical biopsy results.  2. Screen for STD (sexually transmitted disease) Patient declined full STD screening. - C. trachomatis/N. gonorrhoeae RNA  Counseling on above issues and coordination of care more than 50% for 15 minutes.  Genia Del MD, 2:42 PM 05/28/2018

## 2018-05-28 NOTE — Patient Instructions (Signed)
1. 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  HPV 16-18-45 were negative.  Possible remote history of abnormal Pap test with cervical biopsies.  No history of cervical treatment.  Pap test normal for many years since then with last one prior to this year in July 2016 showing a negative resolve with negative high-risk HPV.  Significance of high risk HPV and abnormal Pap test discussed with patient.  Colposcopy procedure reviewed.  Patient informed of findings at the time of colposcopy.  Will manage per cervical biopsy results.  2. Screen for STD (sexually transmitted disease) Patient declined full STD screening. - C. trachomatis/N. gonorrhoeae RNA  Miylah, it was a pleasure seeing you today!  I will inform you of your cervical biopsy results as soon as they are available.

## 2018-05-28 NOTE — Addendum Note (Signed)
Addended by: Berna Spare A on: 05/28/2018 03:53 PM   Modules accepted: Orders

## 2018-05-29 LAB — C. TRACHOMATIS/N. GONORRHOEAE RNA
C. trachomatis RNA, TMA: NOT DETECTED
N. GONORRHOEAE RNA, TMA: NOT DETECTED

## 2018-05-31 ENCOUNTER — Encounter: Payer: Self-pay | Admitting: *Deleted

## 2018-05-31 LAB — TISSUE PATH REPORT 10802

## 2018-05-31 LAB — PATHOLOGY

## 2019-05-05 ENCOUNTER — Encounter: Payer: Managed Care, Other (non HMO) | Admitting: Women's Health

## 2021-07-17 ENCOUNTER — Encounter (HOSPITAL_BASED_OUTPATIENT_CLINIC_OR_DEPARTMENT_OTHER): Payer: Self-pay | Admitting: Emergency Medicine

## 2021-07-17 ENCOUNTER — Other Ambulatory Visit: Payer: Self-pay

## 2021-07-17 ENCOUNTER — Emergency Department (HOSPITAL_BASED_OUTPATIENT_CLINIC_OR_DEPARTMENT_OTHER): Payer: Managed Care, Other (non HMO)

## 2021-07-17 ENCOUNTER — Emergency Department (HOSPITAL_BASED_OUTPATIENT_CLINIC_OR_DEPARTMENT_OTHER)
Admission: EM | Admit: 2021-07-17 | Discharge: 2021-07-17 | Disposition: A | Discharge status: Home / Self Care | Payer: Managed Care, Other (non HMO) | Attending: Emergency Medicine | Admitting: Emergency Medicine

## 2021-07-17 DIAGNOSIS — Z20822 Contact with and (suspected) exposure to covid-19: Secondary | ICD-10-CM | POA: Insufficient documentation

## 2021-07-17 DIAGNOSIS — Z79899 Other long term (current) drug therapy: Secondary | ICD-10-CM | POA: Diagnosis not present

## 2021-07-17 DIAGNOSIS — R0602 Shortness of breath: Secondary | ICD-10-CM | POA: Diagnosis present

## 2021-07-17 DIAGNOSIS — J45901 Unspecified asthma with (acute) exacerbation: Secondary | ICD-10-CM | POA: Insufficient documentation

## 2021-07-17 LAB — RESP PANEL BY RT-PCR (FLU A&B, COVID) ARPGX2
Influenza A by PCR: NEGATIVE
Influenza B by PCR: NEGATIVE
SARS Coronavirus 2 by RT PCR: NEGATIVE

## 2021-07-17 MED ORDER — ALBUTEROL SULFATE (5 MG/ML) 0.5% IN NEBU: 2.5000 mg | INHALATION_SOLUTION | Freq: Four times a day (QID) | RESPIRATORY_TRACT | 12 refills | Status: AC | PRN

## 2021-07-17 MED ORDER — IPRATROPIUM-ALBUTEROL 0.5-2.5 (3) MG/3ML IN SOLN
3.0000 mL | Freq: Two times a day (BID) | RESPIRATORY_TRACT | Status: DC
  Administered 2021-07-17: 3 mL via RESPIRATORY_TRACT
  Filled 2021-07-17: qty 3

## 2021-07-17 MED ORDER — IPRATROPIUM-ALBUTEROL 0.5-2.5 (3) MG/3ML IN SOLN
3.0000 mL | Freq: Once | RESPIRATORY_TRACT | Status: AC
  Administered 2021-07-17: 3 mL via RESPIRATORY_TRACT
  Filled 2021-07-17: qty 3

## 2021-07-17 MED ORDER — DEXAMETHASONE SODIUM PHOSPHATE 10 MG/ML IJ SOLN
10.0000 mg | Freq: Once | INTRAMUSCULAR | Status: AC
Start: 2021-07-17 — End: 2021-07-17
  Administered 2021-07-17: 10 mg via INTRAMUSCULAR
  Filled 2021-07-17: qty 1

## 2021-07-17 NOTE — Discharge Instructions (Addendum)
You should talk to primary care clinic about helping to get a nebulizer machine, or you can search one over-the-counter at several pharmacies.  For the next 3 days I want you to give yourself a breathing treatment every 4 hours, whether with a nebulizer machine or 2 puffs of your inhaler with the spacer.  You do not need to do this while you are sleeping. ?

## 2021-07-17 NOTE — ED Provider Notes (Signed)
?[facility] EMERGENCY DEPARTMENT ?Provider Note ? ? ?CSN: [id number] ?Arrival date & time: 07/17/21  1147 ? ?  ? ?History ? ?Chief Complaint  ?Patient presents with  ? Shortness of Breath  ? ? ?MINHA FULCO is a 61 y.o. female with a history of asthma presenting to ED with shortness of breath, cough and congestion.  Patient reports onset of symptoms 7 days ago on Sunday.  She said her PCP put her on doxycycline 5 days ago, which she has been taking.  She reports he continues with sinus congestion, constant cough that is worse at night, shortness of breath, some productive phlegm and sputum.  She has been using Flonase at night, Mucinex in the daytime, as well as using her albuterol pump.  She was also on prednisone 40 mg a day for at least 5 days, says she feels no better after all these medications.  She did have a negative point-of-care COVID test a week ago at the urgent care, but has not been retested since. ? ?HPI ? ?  ? ?Home Medications ?Prior to Admission medications   ?Medication Sig Start Date End Date Taking? Authorizing Provider  ?albuterol (PROVENTIL) (5 MG/ML) 0.5% nebulizer solution Take 0.5 mLs (2.5 mg total) by nebulization every 6 (six) hours as needed for wheezing or shortness of breath. 07/17/21  Yes Malka Bocek, Kermit Balo, MD  ?albuterol (PROVENTIL HFA;VENTOLIN HFA) 108 (90 Base) MCG/ACT inhaler Inhale 2 puffs into the lungs every 6 (six) hours as needed for wheezing or shortness of breath. 10/19/17   Donato Schultz, DO  ?buPROPion (WELLBUTRIN XL) 150 MG 24 hr tablet Take 1 tablet (150 mg total) by mouth daily. 11/14/16   Bradd Canary, MD  ?fexofenadine (ALLEGRA) 30 MG tablet Take 1 tablet (30 mg total) by mouth as needed. 10/28/15   Bradd Canary, MD  ?FLOVENT HFA 220 MCG/ACT inhaler INHALE 2 PUFFS INTO THE LUNGS TWICE DAILY FOR 14 DAYS 03/28/18   Bradd Canary, MD  ?ipratropium (ATROVENT HFA) 17 MCG/ACT inhaler Inhale 2 puffs into the lungs 3 (three) times daily for 7 days.  04/03/17 04/10/17  Bradd Canary, MD  ?ketoconazole (NIZORAL) 2 % cream Reported on 06/17/2015 06/26/14   [provider]  ?methocarbamol (ROBAXIN) 500 MG tablet Take 1 tablet (500 mg total) by mouth 2 (two) times daily as needed for muscle spasms. 11/14/16   Bradd Canary, MD  ?montelukast (SINGULAIR) 10 MG tablet Take 1 tablet (10 mg total) by mouth at bedtime. 05/28/17   Bradd Canary, MD  ?nitrofurantoin (MACRODANTIN) 50 MG capsule Take 50 mg by mouth 4 (four) times daily.    [provider]  ?pantoprazole (PROTONIX) 40 MG tablet Take 40 mg by mouth 2 (two) times daily before a meal.  11/30/14   [provider]  ?triamcinolone cream (KENALOG) 0.1 % Reported on 06/17/2015 06/26/14   [provider]  ?zolpidem (AMBIEN) 10 MG tablet TAKE 1 TABLET BY MOUTH AT BEDTIME AS NEEDED FOR SLEEP 04/24/17   Bradd Canary, MD  ?   ? ?Allergies    ?Gabapentin, Paxil [paroxetine], and Sulfonamide derivatives   ? ?Review of Systems   ?Review of Systems ? ?Physical Exam ?Updated Vital Signs ?BP 124/81   Pulse 78   Temp 98.9 ?F (37.2 ?C) (Oral)   Resp 18   Ht 5\' 4"  (1.626 m)   Wt 69.4 kg   LMP 09/08/2011   SpO2 97%   BMI 26.26 kg/m?  ?  Physical Exam ?Constitutional:   ?   General: She is not in acute distress. ?HENT:  ?   Head: Normocephalic and atraumatic.  ?Eyes:  ?   Conjunctiva/sclera: Conjunctivae normal.  ?   Pupils: Pupils are equal, round, and reactive to light.  ?Cardiovascular:  ?   Rate and Rhythm: Normal rate and regular rhythm.  ?Pulmonary:  ?   Effort: Pulmonary effort is normal. No respiratory distress.  ?   Comments: Wet cough, speaking comfortably in full sentences, wheezing in the left upper and lower lung fields, rhonchi in the left lower lung field ?Abdominal:  ?   General: There is no distension.  ?   Tenderness: There is no abdominal tenderness.  ?Skin: ?   General: Skin is warm and dry.  ?Neurological:  ?   General: No focal deficit present.  ?   Mental Status: She is  alert. Mental status is at baseline.  ?Psychiatric:     ?   Mood and Affect: Mood normal.     ?   Behavior: Behavior normal.  ? ? ?ED Results / Procedures / Treatments   ?Labs ?(all labs ordered are listed, but only abnormal results are displayed) ?Labs Reviewed  ?RESP PANEL BY RT-PCR (FLU A&B, COVID) [id number]  ? ? ?EKG ?None ? ?Radiology ?DG Chest 2 View ? ?Result Date: 07/17/2021 ?CLINICAL DATA:  Cough for 1 week EXAM: CHEST - 2 VIEW COMPARISON:  04/26/2018 FINDINGS: The heart size and mediastinal contours are within normal limits. Minimal streaky left basilar opacity. Right lung is clear. No pleural effusion or pneumothorax. The visualized skeletal structures are unremarkable. IMPRESSION: Minimal streaky left basilar opacity, favor atelectasis. Electronically Signed   By: Duanne GuessNicholas  Plundo D.O.   On: 07/17/2021 12:46   ? ?Procedures ?Procedures  ? ? ?Medications Ordered in ED ?Medications  ?ipratropium-albuterol (DUONEB) 0.5-2.5 (3) MG/3ML nebulizer solution 3 mL (3 mLs Nebulization Given 07/17/21 1328)  ?ipratropium-albuterol (DUONEB) 0.5-2.5 (3) MG/3ML nebulizer solution 3 mL (3 mLs Nebulization Given 07/17/21 1223)  ?dexamethasone (DECADRON) injection 10 mg (10 mg Intramuscular Given 07/17/21 1435)  ? ? ?ED Course/ Medical Decision Making/ A&P ?Clinical Course as of 07/17/21 1523  ?Wynelle LinkSun Jul 17, 2021  ?1325 Patient is feeling better after initial DuoNeb but now more audibly wheezing, I suspect this may in fact be related to asthma.  I have ordered 2 more back-to-back DuoNebs for her. [MT]  ?  ?Clinical Course User Index ?[MT] Terald Sleeperrifan, Shron Ozer J, MD  ? ?                        ?Medical Decision Making ?Amount and/or Complexity of Data Reviewed ?Radiology: ordered. ? ?Risk ?Prescription drug management. ? ? ?Patient is here with suspected viral URI symptoms.  She has had coughing and wheezing for about a week.  She has already completed a steroid course and is 5 days into a doxycycline course for possible pneumonia.  I have  ordered an x-ray to evaluate for pneumonia, and personally reviewed and interpreted this, showing no focal infiltrate, questionable atelectasis of the left lower lobe. ? ?DuoNebs ordered for wheezing on exam, there may be some asthma component.  There is mild wheezing.  On reassessment the patient feels significantly improved after her second DuoNeb treatment. ? ?I did discuss the COVID PCR test which may be more accurate than a point-of-care test, and she is agreeable. ? ?Low suspicion for pulmonary embolism or ACS ? ? ? ? ? ? ? ?  Final Clinical Impression(s) / ED Diagnoses ?Final diagnoses:  ?Moderate asthma with exacerbation, unspecified whether persistent  ? ? ?Rx / DC Orders ?ED Discharge Orders   ? ?      Ordered  ?  albuterol (PROVENTIL) (5 MG/ML) 0.5% nebulizer solution  Every 6 hours PRN       ? 07/17/21 1430  ? ?  ?  ? ?  ? ? ?  ?Terald Sleeper, MD ?07/17/21 1523 ? ?

## 2021-07-17 NOTE — ED Triage Notes (Signed)
Pt arrives pov, steady gait to room, reports shob with cough and congestion. Was treated at UC 1 week pta, currently taking abx. Recent Neg covid test.  ?

## 2021-07-17 NOTE — ED Notes (Signed)
Ambulated from lobby to room 7, HR 110 max, SpO2 96-100%, congested cough. ?

## 2022-04-02 ENCOUNTER — Encounter (HOSPITAL_BASED_OUTPATIENT_CLINIC_OR_DEPARTMENT_OTHER): Payer: Self-pay | Admitting: Emergency Medicine

## 2022-04-02 ENCOUNTER — Other Ambulatory Visit: Payer: Self-pay

## 2022-04-02 ENCOUNTER — Emergency Department (HOSPITAL_BASED_OUTPATIENT_CLINIC_OR_DEPARTMENT_OTHER)
Admission: EM | Admit: 2022-04-02 | Discharge: 2022-04-02 | Disposition: A | Discharge status: Home / Self Care | Payer: Managed Care, Other (non HMO) | Attending: Emergency Medicine | Admitting: Emergency Medicine

## 2022-04-02 DIAGNOSIS — N3000 Acute cystitis without hematuria: Secondary | ICD-10-CM | POA: Diagnosis not present

## 2022-04-02 DIAGNOSIS — M545 Low back pain, unspecified: Secondary | ICD-10-CM | POA: Diagnosis present

## 2022-04-02 LAB — URINALYSIS, ROUTINE W REFLEX MICROSCOPIC
Bilirubin Urine: NEGATIVE
Glucose, UA: 100 mg/dL — AB
Hgb urine dipstick: NEGATIVE
Ketones, ur: NEGATIVE mg/dL
Nitrite: POSITIVE — AB
Protein, ur: 30 mg/dL — AB
Specific Gravity, Urine: 1.015 (ref 1.005–1.030)
pH: 6 (ref 5.0–8.0)

## 2022-04-02 LAB — URINALYSIS, MICROSCOPIC (REFLEX)

## 2022-04-02 MED ORDER — CEPHALEXIN 500 MG PO CAPS: 500.0000 mg | ORAL_CAPSULE | Freq: Four times a day (QID) | ORAL | 0 refills | Status: AC

## 2022-04-02 MED ORDER — PHENAZOPYRIDINE HCL 200 MG PO TABS: 200.0000 mg | ORAL_TABLET | Freq: Three times a day (TID) | ORAL | 0 refills | Status: AC

## 2022-04-02 NOTE — ED Notes (Addendum)
UTI sx, burning, back ache 1.5x weeks, usually has back ache with UTI. Pt endorses some nausea. Symptoms went away with Piridium Rx.

## 2022-04-02 NOTE — Discharge Instructions (Signed)
You are diagnosed with a UTI in the ER today.  I have given you a prescription for an antibiotic for you to take as prescribed in its entirety for management of your symptoms.  I have also given you Pyridium which should help for the pain associated with the UTI.  We did also culture your urine and if the antibiotic we sent you you was resistant to the bacteria causing your infection and then you will get a phone call.  Otherwise, please follow-up with your primary care doctor and your urologist for continued evaluation and management of your symptoms.  Return if development of any new or worsening symptoms.

## 2022-04-02 NOTE — ED Provider Notes (Signed)
[facility] EMERGENCY DEPARTMENT Provider Note   CSN: [id number] Arrival date & time: 04/02/22  1352     History  Chief Complaint  Patient presents with   Back Pain    Patricia Roy is a 61 y.o. female.  Patient with history of recurrent UTIs presents today with complaints of right low back pain.  She states that same has been ongoing for the past 1 week.  She denies any fevers or chills. States that she was having dysuria as well until yesterday but took pyridium with resolution of these symptoms. Denies any known injury. States her pain feels similar to UTIs in the past. She states that her last UTI was in August and she was initially started on Ciprofloxacin until a culture grew e coli resistant to fluoroquinolones. States that this UTI was susceptible to macrobid and she was subsequently switched to same with resolution of symptoms. She states that after this she was told to take macrobid daily for UTI prophylaxis. She has continued to do so throughout her current symptoms without improvement. Denies fevers, chills, nausea, or vomiting.   The history is provided by the patient. No language interpreter was used.  Back Pain Associated symptoms: dysuria        Home Medications Prior to Admission medications   Medication Sig Start Date End Date Taking? Authorizing Provider  albuterol (PROVENTIL HFA;VENTOLIN HFA) 108 (90 Base) MCG/ACT inhaler Inhale 2 puffs into the lungs every 6 (six) hours as needed for wheezing or shortness of breath. 10/19/17   Seabron Spates R, DO  albuterol (PROVENTIL) (5 MG/ML) 0.5% nebulizer solution Take 0.5 mLs (2.5 mg total) by nebulization every 6 (six) hours as needed for wheezing or shortness of breath. 07/17/21   Terald Sleeper, MD  buPROPion (WELLBUTRIN XL) 150 MG 24 hr tablet Take 1 tablet (150 mg total) by mouth daily. 11/14/16   Bradd Canary, MD  fexofenadine (ALLEGRA) 30 MG tablet Take 1 tablet (30 mg total) by mouth as needed.  10/28/15   Bradd Canary, MD  FLOVENT HFA 220 MCG/ACT inhaler INHALE 2 PUFFS INTO THE LUNGS TWICE DAILY FOR 14 DAYS 03/28/18   Bradd Canary, MD  ipratropium (ATROVENT HFA) 17 MCG/ACT inhaler Inhale 2 puffs into the lungs 3 (three) times daily for 7 days. 04/03/17 04/10/17  Bradd Canary, MD  ketoconazole (NIZORAL) 2 % cream Reported on 06/17/2015 06/26/14   [provider]  methocarbamol (ROBAXIN) 500 MG tablet Take 1 tablet (500 mg total) by mouth 2 (two) times daily as needed for muscle spasms. 11/14/16   Bradd Canary, MD  montelukast (SINGULAIR) 10 MG tablet Take 1 tablet (10 mg total) by mouth at bedtime. 05/28/17   Bradd Canary, MD  nitrofurantoin (MACRODANTIN) 50 MG capsule Take 50 mg by mouth 4 (four) times daily.    [provider]  pantoprazole (PROTONIX) 40 MG tablet Take 40 mg by mouth 2 (two) times daily before a meal.  11/30/14   [provider]  triamcinolone cream (KENALOG) 0.1 % Reported on 06/17/2015 06/26/14   [provider]  zolpidem (AMBIEN) 10 MG tablet TAKE 1 TABLET BY MOUTH AT BEDTIME AS NEEDED FOR SLEEP 04/24/17   Bradd Canary, MD      Allergies    Gabapentin, Paxil [paroxetine], and Sulfonamide derivatives    Review of Systems   Review of Systems  Genitourinary:  Positive for dysuria.  Musculoskeletal:  Positive for back pain.  All other  systems reviewed and are negative.   Physical Exam Updated Vital Signs BP (!) 156/87 (BP Location: Left Arm)   Pulse 72   Temp (!) 96.7 F (35.9 C) (Tympanic)   Resp 20   Ht 5\' 4"  (1.626 m)   Wt 68.5 kg   LMP 09/08/2011   SpO2 100%   BMI 25.92 kg/m  Physical Exam Vitals and nursing note reviewed.  Constitutional:      General: She is not in acute distress.    Appearance: Normal appearance. She is normal weight. She is not ill-appearing, toxic-appearing or diaphoretic.  HENT:     Head: Normocephalic and atraumatic.  Cardiovascular:     Rate and Rhythm: Normal rate.   Pulmonary:     Effort: Pulmonary effort is normal. No respiratory distress.  Abdominal:     General: Abdomen is flat.     Palpations: Abdomen is soft.     Tenderness: There is no abdominal tenderness. There is no right CVA tenderness or left CVA tenderness.  Musculoskeletal:        General: Normal range of motion.     Cervical back: Normal range of motion.     Comments: No midline lumbar spine tenderness.  No overlying skin changes.  Patient ambulatory with steady gait.  Skin:    General: Skin is warm and dry.  Neurological:     General: No focal deficit present.     Mental Status: She is alert.  Psychiatric:        Mood and Affect: Mood normal.        Behavior: Behavior normal.     ED Results / Procedures / Treatments   Labs (all labs ordered are listed, but only abnormal results are displayed) Labs Reviewed  URINALYSIS, ROUTINE W REFLEX MICROSCOPIC - Abnormal; Notable for the following components:      Result Value   Color, Urine ORANGE (*)    Glucose, UA 100 (*)    Protein, ur 30 (*)    Nitrite POSITIVE (*)    Leukocytes,Ua LARGE (*)    All other components within normal limits  URINALYSIS, MICROSCOPIC (REFLEX) - Abnormal; Notable for the following components:   Bacteria, UA FEW (*)    All other components within normal limits  URINE CULTURE    EKG None  Radiology No results found.  Procedures Procedures    Medications Ordered in ED Medications - No data to display  ED Course/ Medical Decision Making/ A&P                           Medical Decision Making Amount and/or Complexity of Data Reviewed Labs: ordered.   This patient is a 61 y.o. female who presents to the ED for concern of flank pain and dysuria, this involves an extensive number of treatment options, and is a complaint that carries with it a high risk of complications and morbidity. The emergent differential diagnosis prior to evaluation includes, but is not limited to,  trauma, injury, UTI,  pyelonephritis.   This is not an exhaustive differential.   Past Medical History / Co-morbidities / Social History: Hx recurrent UTIs  Additional history: Chart reviewed. Pertinent results include: in 08/23 patient had urine culture that grew resistance to Bactrim and fluoroquinolones.  Since that time she has been on daily macrobid  Physical Exam: Physical exam performed. The pertinent findings include: No CVA tenderness  Lab Tests: I ordered, and personally interpreted labs.  The pertinent  results include:  UA with positive nitrites and large leukocytes. Culture pending    Disposition:  Patient presents today with complaints of right sided flank pain and dysuria x 1 week. She is afebrile, non-toxic appearing, and in no acute distress with reassuring vital signs.  Physical exam reveals no CVA tenderness or palpable abdominal tenderness.  No signs of acute pyelonephritis.  UA shows infection. She has already been taking macrobid for UTI prophylaxis and recently grew e coli resistant to cipro. Culture at that time was susceptible to cephalosporins.  Therefore will send with Keflex.  Urine culture pending.  Recommend close urology follow-up.  Patient is stable for discharge.  She is understanding and amenable with plan, educated on red flag symptoms that would prompt immediate return.  Patient discharged in stable condition.  Findings and plan of care discussed with supervising physician Dr. Eloise Harman who is in agreement.    Final Clinical Impression(s) / ED Diagnoses Final diagnoses:  Acute cystitis without hematuria    Rx / DC Orders ED Discharge Orders          Ordered    cephALEXin (KEFLEX) 500 MG capsule  4 times daily        04/02/22 1708    phenazopyridine (PYRIDIUM) 200 MG tablet  3 times daily        04/02/22 1712          An After Visit Summary was printed and given to the patient.     Vear Clock 04/02/22 1717    Rondel Baton, MD 04/03/22  [phone]

## 2022-04-02 NOTE — ED Triage Notes (Signed)
Pt reports lower back pain for about a week; thinks she has a UTI

## 2022-04-03 LAB — URINE CULTURE

## 2022-06-18 ENCOUNTER — Encounter (HOSPITAL_BASED_OUTPATIENT_CLINIC_OR_DEPARTMENT_OTHER): Payer: Self-pay | Admitting: Emergency Medicine

## 2022-06-18 ENCOUNTER — Other Ambulatory Visit: Payer: Self-pay

## 2022-06-18 ENCOUNTER — Emergency Department (HOSPITAL_BASED_OUTPATIENT_CLINIC_OR_DEPARTMENT_OTHER)
Admission: EM | Admit: 2022-06-18 | Discharge: 2022-06-18 | Disposition: A | Discharge status: Home / Self Care | Payer: Managed Care, Other (non HMO) | Attending: Emergency Medicine | Admitting: Emergency Medicine

## 2022-06-18 DIAGNOSIS — X58XXXA Exposure to other specified factors, initial encounter: Secondary | ICD-10-CM | POA: Insufficient documentation

## 2022-06-18 DIAGNOSIS — M5441 Lumbago with sciatica, right side: Secondary | ICD-10-CM | POA: Insufficient documentation

## 2022-06-18 DIAGNOSIS — S300XXA Contusion of lower back and pelvis, initial encounter: Secondary | ICD-10-CM | POA: Diagnosis not present

## 2022-06-18 DIAGNOSIS — M545 Low back pain, unspecified: Secondary | ICD-10-CM | POA: Diagnosis present

## 2022-06-18 MED ORDER — KETOROLAC TROMETHAMINE 30 MG/ML IJ SOLN
30.0000 mg | Freq: Once | INTRAMUSCULAR | Status: AC
Start: 2022-06-18 — End: 2022-06-18
  Administered 2022-06-18: 30 mg via INTRAMUSCULAR
  Filled 2022-06-18: qty 1

## 2022-06-18 MED ORDER — CYCLOBENZAPRINE HCL 5 MG PO TABS: 5.0000 mg | ORAL_TABLET | Freq: Three times a day (TID) | ORAL | 0 refills | Status: AC | PRN

## 2022-06-18 MED ORDER — LIDOCAINE 4 % EX PTCH: 1.0000 | MEDICATED_PATCH | CUTANEOUS | 0 refills | Status: AC

## 2022-06-18 MED ORDER — CYCLOBENZAPRINE HCL 5 MG PO TABS
5.0000 mg | ORAL_TABLET | Freq: Once | ORAL | Status: AC
  Administered 2022-06-18: 5 mg via ORAL
  Filled 2022-06-18: qty 1

## 2022-06-18 MED ORDER — LIDOCAINE 5 % EX PTCH
1.0000 | MEDICATED_PATCH | CUTANEOUS | Status: DC
  Administered 2022-06-18: 1 via TRANSDERMAL
  Filled 2022-06-18: qty 1

## 2022-06-18 MED ORDER — KETOROLAC TROMETHAMINE 10 MG PO TABS: 10.0000 mg | ORAL_TABLET | Freq: Four times a day (QID) | ORAL | 0 refills | Status: AC | PRN

## 2022-06-18 NOTE — ED Triage Notes (Signed)
Pt arrive pov, to triage in wheelchair with c/o RT side lower back pain radiating to RT leg x 3 days. Denies dysuria or injury

## 2022-06-18 NOTE — Discharge Instructions (Addendum)
Do not use ibuprofen, Aleve, other anti-inflammatories with the Toradol for your pain control.  Follow-up with your primary care doctor.  If you have any loss of bladder bowel, bladder, fever, chills please return to the ER.  I have provided you with information for sciatica rehab to help with the process.

## 2022-06-18 NOTE — ED Provider Notes (Signed)
[facility] Provider Note   CSN: [id number] Arrival date & time: 06/18/22  [id number]     History  Chief Complaint  Patient presents with   Back Pain    Patricia Roy is a 62 y.o. female, no pertinent past medical history, who presents to the ED secondary to right buttocks pain that radiates down the right leg has been going on for the last 3 days.  States she was sitting at her desk, when then she had a sharp pain in her back, and it now has started radiating down her leg.  Denies any trauma, however does state that she has been rubbing the area, quite a bit and it is caused some bruising.  Denies any recent falls.  No loss of bowel or bladder, no fevers.  No IV drug use.  Has tried ibuprofen and Advil, without relief.  Put some IcyHot on it with some relief.     Home Medications Prior to Admission medications   Medication Sig Start Date End Date Taking? Authorizing Provider  cyclobenzaprine (FLEXERIL) 5 MG tablet Take 1 tablet (5 mg total) by mouth 3 (three) times daily as needed for muscle spasms. 06/18/22  Yes Lillyian Heidt L, PA  ketorolac (TORADOL) 10 MG tablet Take 1 tablet (10 mg total) by mouth every 6 (six) hours as needed. 06/18/22  Yes Kalmen Lollar L, PA  lidocaine (HM LIDOCAINE PATCH) 4 % Place 1 patch onto the skin daily. 06/18/22  Yes Kiwan Gadsden L, PA  albuterol (PROVENTIL HFA;VENTOLIN HFA) 108 (90 Base) MCG/ACT inhaler Inhale 2 puffs into the lungs every 6 (six) hours as needed for wheezing or shortness of breath. 10/19/17   Roma Schanz R, DO  albuterol (PROVENTIL) (5 MG/ML) 0.5% nebulizer solution Take 0.5 mLs (2.5 mg total) by nebulization every 6 (six) hours as needed for wheezing or shortness of breath. 07/17/21   Wyvonnia Dusky, MD  buPROPion (WELLBUTRIN XL) 150 MG 24 hr tablet Take 1 tablet (150 mg total) by mouth daily. 11/14/16   Mosie Lukes, MD  fexofenadine (ALLEGRA) 30 MG tablet Take 1 tablet (30 mg total) by  mouth as needed. 10/28/15   Mosie Lukes, MD  FLOVENT HFA 220 MCG/ACT inhaler INHALE 2 PUFFS INTO THE LUNGS TWICE DAILY FOR 14 DAYS 03/28/18   Mosie Lukes, MD  ipratropium (ATROVENT HFA) 17 MCG/ACT inhaler Inhale 2 puffs into the lungs 3 (three) times daily for 7 days. 04/03/17 04/10/17  Mosie Lukes, MD  ketoconazole (NIZORAL) 2 % cream Reported on 06/17/2015 06/26/14   [provider]  methocarbamol (ROBAXIN) 500 MG tablet Take 1 tablet (500 mg total) by mouth 2 (two) times daily as needed for muscle spasms. 11/14/16   Mosie Lukes, MD  montelukast (SINGULAIR) 10 MG tablet Take 1 tablet (10 mg total) by mouth at bedtime. 05/28/17   Mosie Lukes, MD  nitrofurantoin (MACRODANTIN) 50 MG capsule Take 50 mg by mouth 4 (four) times daily.    [provider]  pantoprazole (PROTONIX) 40 MG tablet Take 40 mg by mouth 2 (two) times daily before a meal.  11/30/14   [provider]  phenazopyridine (PYRIDIUM) 200 MG tablet Take 1 tablet (200 mg total) by mouth 3 (three) times daily. 04/02/22   Smoot, Leary Roca, PA-C  triamcinolone cream (KENALOG) 0.1 % Reported on 06/17/2015 06/26/14   [provider]  zolpidem (AMBIEN) 10 MG tablet TAKE 1 TABLET BY MOUTH AT  BEDTIME AS NEEDED FOR SLEEP 04/24/17   Mosie Lukes, MD      Allergies    Paxil [paroxetine] and Sulfonamide derivatives    Review of Systems   Review of Systems  Respiratory:  Negative for shortness of breath.   Cardiovascular:  Negative for chest pain.  Musculoskeletal:  Positive for back pain.    Physical Exam Updated Vital Signs BP (!) 140/87   Pulse 79   Temp 97.7 F (36.5 C) (Oral)   Resp 16   Wt 68 kg   LMP 09/08/2011   SpO2 97%   BMI 25.75 kg/m  Physical Exam Vitals and nursing note reviewed.  Constitutional:      General: She is not in acute distress.    Appearance: She is well-developed.  HENT:     Head: Normocephalic and atraumatic.  Eyes:     Conjunctiva/sclera: Conjunctivae  normal.  Cardiovascular:     Rate and Rhythm: Normal rate and regular rhythm.     Heart sounds: No murmur heard. Pulmonary:     Effort: Pulmonary effort is normal. No respiratory distress.     Breath sounds: Normal breath sounds.  Abdominal:     Palpations: Abdomen is soft.     Tenderness: There is no abdominal tenderness.  Musculoskeletal:     Cervical back: Neck supple.     Comments: Tenderness to palpation along right glute, and a positive straight leg raise.  Range of motion intact.  No neurodeficits.  Skin:    General: Skin is warm and dry.     Capillary Refill: Capillary refill takes less than 2 seconds.     Comments: Ecchymosis along right buttocks, no evidence of rash  Neurological:     Mental Status: She is alert.  Psychiatric:        Mood and Affect: Mood normal.     ED Results / Procedures / Treatments   Labs (all labs ordered are listed, but only abnormal results are displayed) Labs Reviewed - No data to display  EKG None  Radiology No results found.  Procedures Procedures   Medications Ordered in ED Medications  ketorolac (TORADOL) 30 MG/ML injection 30 mg (has no administration in time range)  cyclobenzaprine (FLEXERIL) tablet 5 mg (has no administration in time range)  lidocaine (LIDODERM) 5 % 1 patch (has no administration in time range)    ED Course/ Medical Decision Making/ A&P                             Medical Decision Making Patient is a 62 year old female, here for right back pain radiating down the leg, denies any trauma, states she has been using ibuprofen, without relief.  With she has positive straight leg raise, and tenderness to palpation along her right SI joint, and glutes, which is concerning for SI joint dysfunction with sciatica versus just plain sciatica.  Ecchymosis is evident, but she states this is not from trauma but from just her rubbing the area to help with the pain.  We will give her Toradol, lidocaine patch, and Flexeril.   Discharged her with these.  We discussed that narcotics are not indicated for acute back pain, and discussed follow-up with PCP.  Stretches provided.  Discussed return precautions.  No red flags on exam.  Risk Prescription drug management.    Final Clinical Impression(s) / ED Diagnoses Final diagnoses:  Acute back pain with sciatica, right    Rx / DC  Orders ED Discharge Orders          Ordered    cyclobenzaprine (FLEXERIL) 5 MG tablet  3 times daily PRN        06/18/22 0921    ketorolac (TORADOL) 10 MG tablet  Every 6 hours PRN        06/18/22 0921    lidocaine (HM LIDOCAINE PATCH) 4 %  Every 24 hours        06/18/22 0921              Marjani Kobel, Si Gaul, PA 06/18/22 [id number]    Charlesetta Shanks, MD 06/21/22 [phone]
